# Patient Record
Sex: Female | Born: 1935 | Race: White | Hispanic: No | Smoking: Never smoker
Health system: Southern US, Community
[De-identification: ages and names within clinical notes are randomized; demographics above are authoritative.]

## PROBLEM LIST (undated history)

## (undated) DIAGNOSIS — Z9181 History of falling: Secondary | ICD-10-CM

## (undated) DIAGNOSIS — G903 Multi-system degeneration of the autonomic nervous system: Secondary | ICD-10-CM

## (undated) DIAGNOSIS — H9190 Unspecified hearing loss, unspecified ear: Secondary | ICD-10-CM

## (undated) DIAGNOSIS — R251 Tremor, unspecified: Secondary | ICD-10-CM

## (undated) DIAGNOSIS — M6259 Muscle wasting and atrophy, not elsewhere classified, multiple sites: Secondary | ICD-10-CM

## (undated) DIAGNOSIS — G9340 Encephalopathy, unspecified: Secondary | ICD-10-CM

## (undated) DIAGNOSIS — I160 Hypertensive urgency: Secondary | ICD-10-CM

## (undated) DIAGNOSIS — N049 Nephrotic syndrome with unspecified morphologic changes: Secondary | ICD-10-CM

## (undated) DIAGNOSIS — E871 Hypo-osmolality and hyponatremia: Secondary | ICD-10-CM

## (undated) DIAGNOSIS — F339 Major depressive disorder, recurrent, unspecified: Secondary | ICD-10-CM

## (undated) DIAGNOSIS — I10 Essential (primary) hypertension: Secondary | ICD-10-CM

## (undated) DIAGNOSIS — G250 Essential tremor: Secondary | ICD-10-CM

## (undated) DIAGNOSIS — R41841 Cognitive communication deficit: Secondary | ICD-10-CM

## (undated) HISTORY — DX: Essential (primary) hypertension: I10

## (undated) HISTORY — DX: Encephalopathy, unspecified: G93.40

## (undated) HISTORY — DX: Cognitive communication deficit: R41.841

## (undated) HISTORY — DX: Muscle wasting and atrophy, not elsewhere classified, multiple sites: M62.59

## (undated) HISTORY — DX: Nephrotic syndrome with unspecified morphologic changes: N04.9

## (undated) HISTORY — DX: History of falling: Z91.81

## (undated) HISTORY — PX: ABDOMINAL HYSTERECTOMY: SHX81

## (undated) HISTORY — PX: SPINAL FUSION: SHX223

## (undated) HISTORY — DX: Essential tremor: G25.0

## (undated) HISTORY — DX: Tremor, unspecified: R25.1

## (undated) HISTORY — DX: Hypo-osmolality and hyponatremia: E87.1

## (undated) HISTORY — DX: Hypertensive urgency: I16.0

## (undated) HISTORY — DX: Multi-system degeneration of the autonomic nervous system: G90.3

## (undated) HISTORY — DX: Unspecified hearing loss, unspecified ear: H91.90

## (undated) HISTORY — DX: Major depressive disorder, recurrent, unspecified: F33.9

---

## 2005-12-09 ENCOUNTER — Encounter: Admission: RE | Admit: 2005-12-09 | Discharge: 2005-12-09 | Payer: Self-pay | Admitting: Unknown Physician Specialty

## 2006-05-31 ENCOUNTER — Encounter: Admission: RE | Admit: 2006-05-31 | Discharge: 2006-05-31 | Payer: Self-pay | Admitting: Family Medicine

## 2006-11-28 ENCOUNTER — Encounter: Admission: RE | Admit: 2006-11-28 | Discharge: 2006-11-28 | Payer: Self-pay | Admitting: Family Medicine

## 2007-08-08 ENCOUNTER — Ambulatory Visit (HOSPITAL_COMMUNITY): Admission: RE | Admit: 2007-08-08 | Discharge: 2007-08-08 | Payer: Self-pay | Admitting: Nephrology

## 2007-08-10 ENCOUNTER — Ambulatory Visit (HOSPITAL_COMMUNITY): Admission: RE | Admit: 2007-08-10 | Discharge: 2007-08-11 | Payer: Self-pay | Admitting: Nephrology

## 2007-08-10 ENCOUNTER — Encounter (INDEPENDENT_AMBULATORY_CARE_PROVIDER_SITE_OTHER): Payer: Self-pay | Admitting: Nephrology

## 2009-01-09 IMAGING — US US BIOPSY-NO RADIOLOGIST
1 series · 8 of 8 positions shown · non-contrast
Comparison: none

CLINICAL DATA: Proteinuria

ULTRASOUND BIOPSY - NO RADIOLOGIST:

[Series 1: unknown · 0.27mm/px · 8 of 8 slices shown]
[im 1/8]
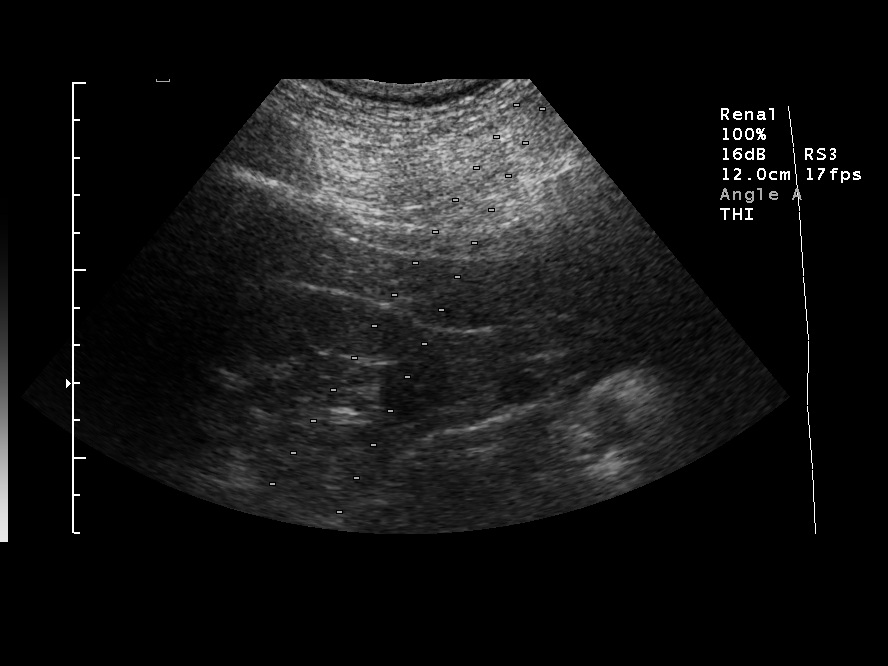
[im 2/8]
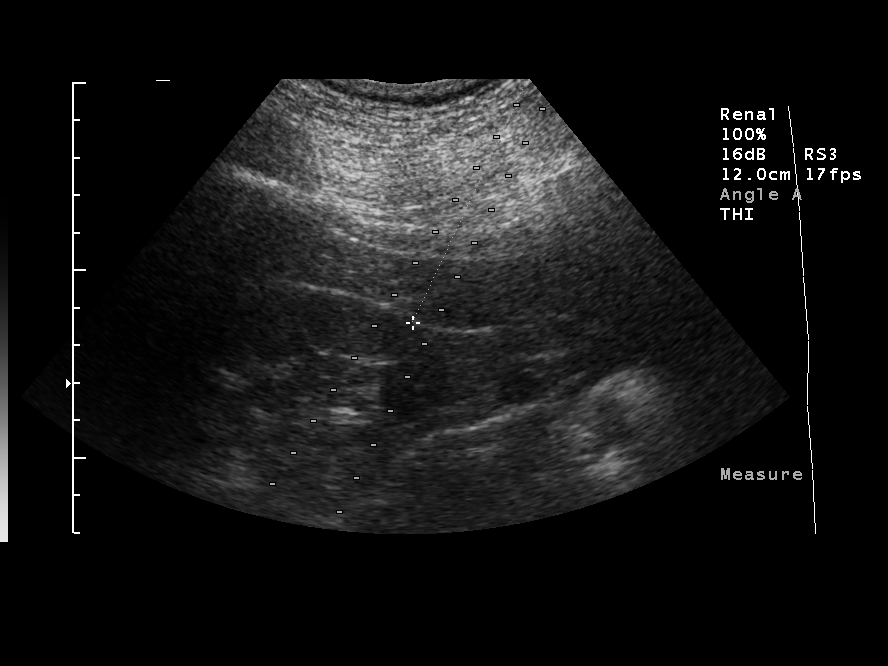
[im 3/8]
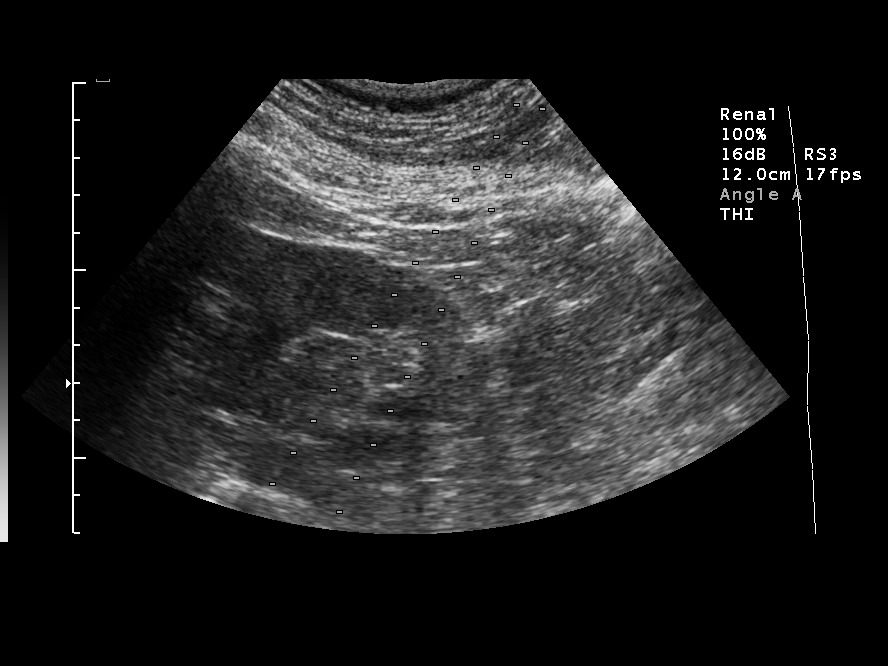
[im 4/8]
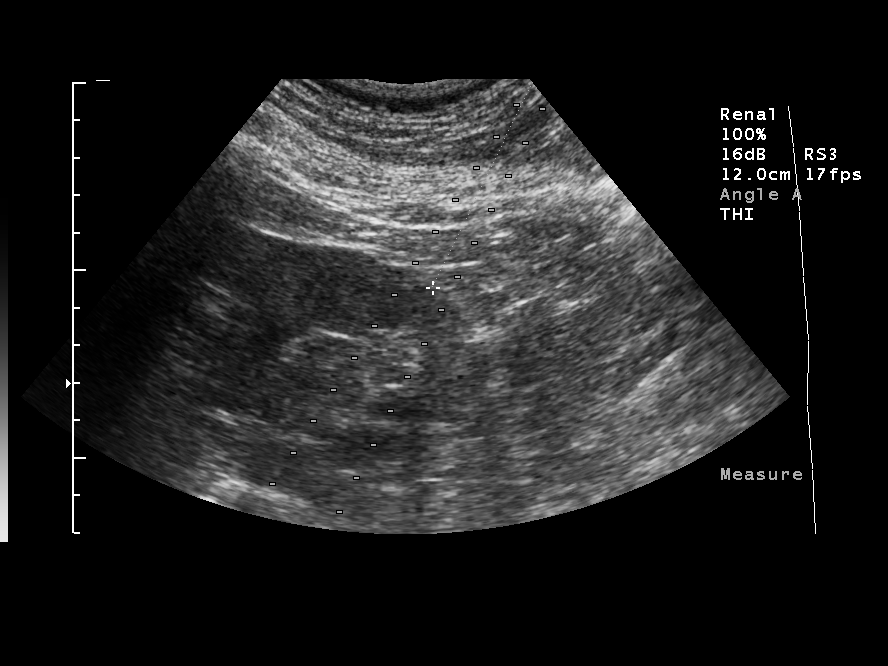
[im 5/8]
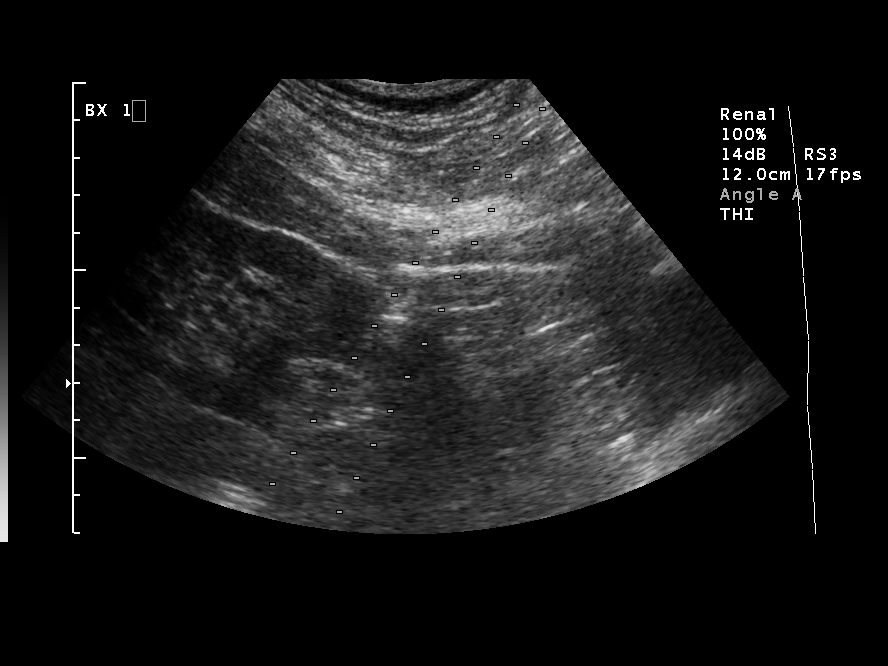
[im 6/8]
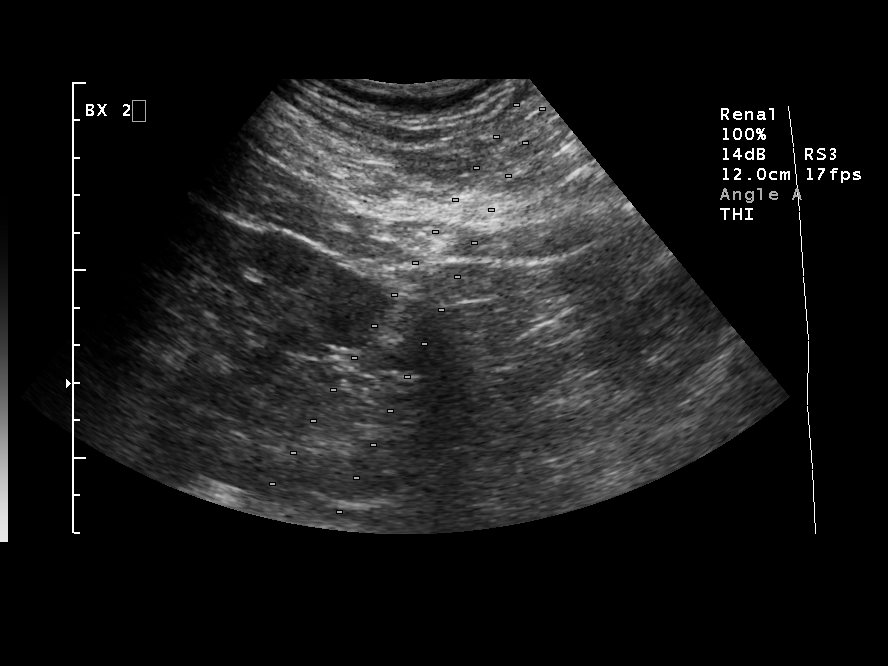
[im 7/8]
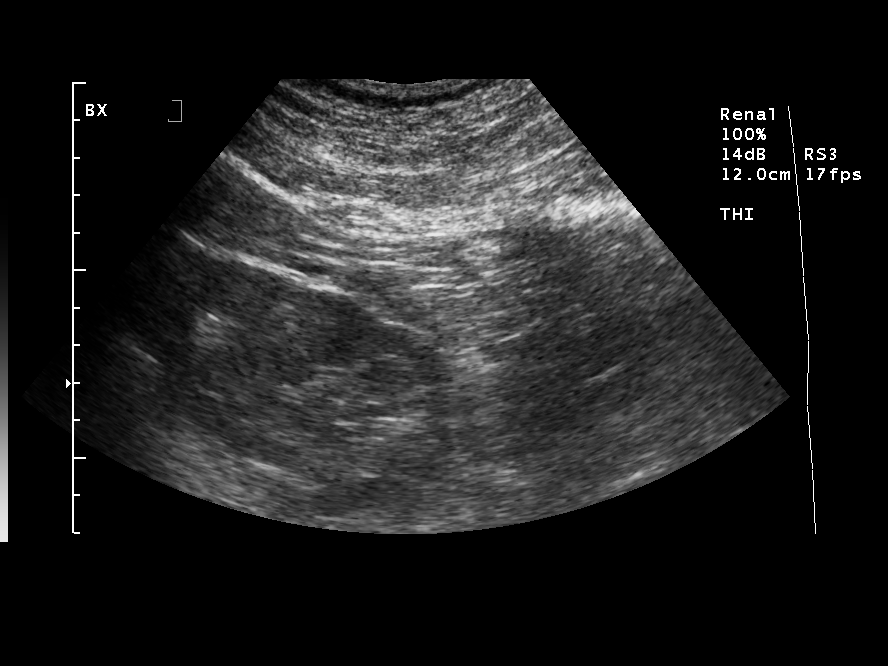
[im 8/8]
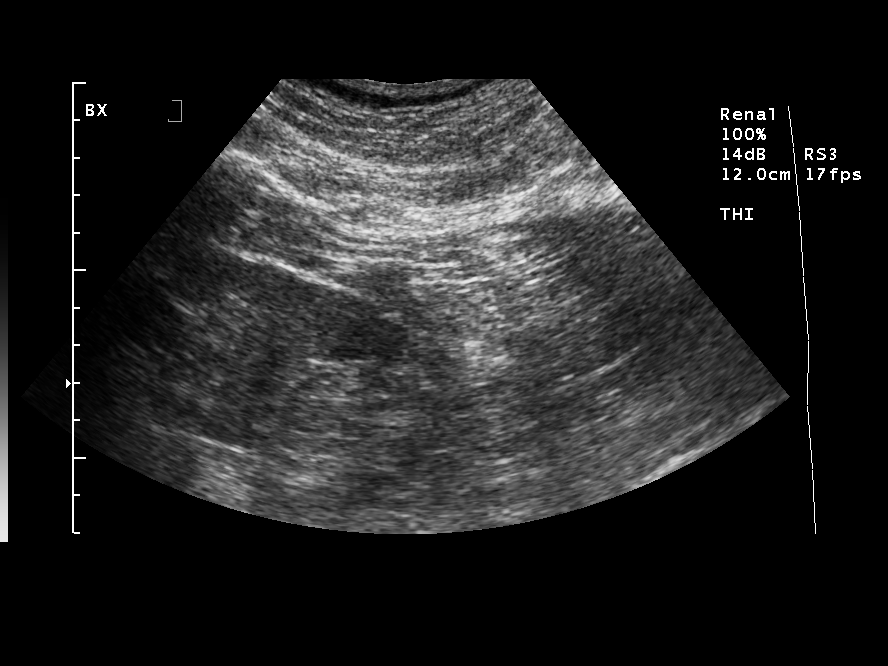

[8 of 8 positions shown; findings below may reference images not displayed]

FINDINGS: Ultrasound was provided to Dr. Jann for purposes
of random renal biopsy.  No radiologist was present for the
procedure.
IMPRESSION: Ultrasound provided to Dr. Jann for purposes of random
renal biopsy.

## 2011-01-19 LAB — CBC
HCT: 42.8
Hemoglobin: 15
MCHC: 34.8
MCV: 95.1
MCV: 94.6
Platelets: 276
Platelets: 254
RBC: 4.05
RBC: 4.26
RDW: 13.7
RDW: 13.5
RDW: 13.6
WBC: 11.9 — ABNORMAL HIGH

## 2011-01-19 LAB — COMPREHENSIVE METABOLIC PANEL
Albumin: 3 — ABNORMAL LOW
Alkaline Phosphatase: 63
BUN: 10
CO2: 29
Calcium: 9.5
Chloride: 97
Creatinine, Ser: 0.61
GFR calc non Af Amer: >60
Total Protein: 5.9 — ABNORMAL LOW

## 2011-01-19 LAB — TYPE AND SCREEN: ABO/RH(D): O POS

## 2011-01-19 LAB — PROTIME-INR
INR: 0.8
Prothrombin Time: 11.5 — ABNORMAL LOW

## 2011-01-19 LAB — DIFFERENTIAL
Eosinophils Absolute: 0.1
Lymphocytes Relative: 22
Lymphs Abs: 2.4
Monocytes Absolute: 0.6
Neutro Abs: 7.5

## 2011-05-03 DIAGNOSIS — R809 Proteinuria, unspecified: Secondary | ICD-10-CM | POA: Diagnosis not present

## 2011-05-03 DIAGNOSIS — N040 Nephrotic syndrome with minor glomerular abnormality: Secondary | ICD-10-CM | POA: Diagnosis not present

## 2011-05-11 DIAGNOSIS — R609 Edema, unspecified: Secondary | ICD-10-CM | POA: Diagnosis not present

## 2011-05-11 DIAGNOSIS — I10 Essential (primary) hypertension: Secondary | ICD-10-CM | POA: Diagnosis not present

## 2011-05-11 DIAGNOSIS — N040 Nephrotic syndrome with minor glomerular abnormality: Secondary | ICD-10-CM | POA: Diagnosis not present

## 2011-05-11 DIAGNOSIS — R809 Proteinuria, unspecified: Secondary | ICD-10-CM | POA: Diagnosis not present

## 2011-05-25 DIAGNOSIS — R809 Proteinuria, unspecified: Secondary | ICD-10-CM | POA: Diagnosis not present

## 2011-05-25 DIAGNOSIS — N040 Nephrotic syndrome with minor glomerular abnormality: Secondary | ICD-10-CM | POA: Diagnosis not present

## 2011-06-01 DIAGNOSIS — N040 Nephrotic syndrome with minor glomerular abnormality: Secondary | ICD-10-CM | POA: Diagnosis not present

## 2011-06-01 DIAGNOSIS — I10 Essential (primary) hypertension: Secondary | ICD-10-CM | POA: Diagnosis not present

## 2011-06-01 DIAGNOSIS — R809 Proteinuria, unspecified: Secondary | ICD-10-CM | POA: Diagnosis not present

## 2011-06-01 DIAGNOSIS — R609 Edema, unspecified: Secondary | ICD-10-CM | POA: Diagnosis not present

## 2011-06-29 DIAGNOSIS — N040 Nephrotic syndrome with minor glomerular abnormality: Secondary | ICD-10-CM | POA: Diagnosis not present

## 2011-06-29 DIAGNOSIS — R809 Proteinuria, unspecified: Secondary | ICD-10-CM | POA: Diagnosis not present

## 2011-07-30 DIAGNOSIS — N040 Nephrotic syndrome with minor glomerular abnormality: Secondary | ICD-10-CM | POA: Diagnosis not present

## 2011-07-30 DIAGNOSIS — D649 Anemia, unspecified: Secondary | ICD-10-CM | POA: Diagnosis not present

## 2011-07-30 DIAGNOSIS — R809 Proteinuria, unspecified: Secondary | ICD-10-CM | POA: Diagnosis not present

## 2011-08-06 DIAGNOSIS — R809 Proteinuria, unspecified: Secondary | ICD-10-CM | POA: Diagnosis not present

## 2011-08-06 DIAGNOSIS — N181 Chronic kidney disease, stage 1: Secondary | ICD-10-CM | POA: Diagnosis not present

## 2011-09-01 DIAGNOSIS — R809 Proteinuria, unspecified: Secondary | ICD-10-CM | POA: Diagnosis not present

## 2011-09-01 DIAGNOSIS — N040 Nephrotic syndrome with minor glomerular abnormality: Secondary | ICD-10-CM | POA: Diagnosis not present

## 2011-09-01 DIAGNOSIS — N390 Urinary tract infection, site not specified: Secondary | ICD-10-CM | POA: Diagnosis not present

## 2011-09-01 DIAGNOSIS — N181 Chronic kidney disease, stage 1: Secondary | ICD-10-CM | POA: Diagnosis not present

## 2011-09-22 DIAGNOSIS — R809 Proteinuria, unspecified: Secondary | ICD-10-CM | POA: Diagnosis not present

## 2011-09-22 DIAGNOSIS — N181 Chronic kidney disease, stage 1: Secondary | ICD-10-CM | POA: Diagnosis not present

## 2011-10-04 DIAGNOSIS — N181 Chronic kidney disease, stage 1: Secondary | ICD-10-CM | POA: Diagnosis not present

## 2011-10-04 DIAGNOSIS — N390 Urinary tract infection, site not specified: Secondary | ICD-10-CM | POA: Diagnosis not present

## 2011-10-04 DIAGNOSIS — R809 Proteinuria, unspecified: Secondary | ICD-10-CM | POA: Diagnosis not present

## 2011-10-07 DIAGNOSIS — I10 Essential (primary) hypertension: Secondary | ICD-10-CM | POA: Diagnosis not present

## 2011-10-07 DIAGNOSIS — R809 Proteinuria, unspecified: Secondary | ICD-10-CM | POA: Diagnosis not present

## 2011-10-07 DIAGNOSIS — N040 Nephrotic syndrome with minor glomerular abnormality: Secondary | ICD-10-CM | POA: Diagnosis not present

## 2011-11-15 DIAGNOSIS — N390 Urinary tract infection, site not specified: Secondary | ICD-10-CM | POA: Diagnosis not present

## 2011-11-15 DIAGNOSIS — R809 Proteinuria, unspecified: Secondary | ICD-10-CM | POA: Diagnosis not present

## 2011-11-15 DIAGNOSIS — I10 Essential (primary) hypertension: Secondary | ICD-10-CM | POA: Diagnosis not present

## 2011-11-22 DIAGNOSIS — N032 Chronic nephritic syndrome with diffuse membranous glomerulonephritis: Secondary | ICD-10-CM | POA: Diagnosis not present

## 2011-11-22 DIAGNOSIS — I10 Essential (primary) hypertension: Secondary | ICD-10-CM | POA: Diagnosis not present

## 2011-11-22 DIAGNOSIS — R809 Proteinuria, unspecified: Secondary | ICD-10-CM | POA: Diagnosis not present

## 2011-12-28 DIAGNOSIS — N040 Nephrotic syndrome with minor glomerular abnormality: Secondary | ICD-10-CM | POA: Diagnosis not present

## 2011-12-28 DIAGNOSIS — R809 Proteinuria, unspecified: Secondary | ICD-10-CM | POA: Diagnosis not present

## 2011-12-28 DIAGNOSIS — N189 Chronic kidney disease, unspecified: Secondary | ICD-10-CM | POA: Diagnosis not present

## 2012-01-04 DIAGNOSIS — R809 Proteinuria, unspecified: Secondary | ICD-10-CM | POA: Diagnosis not present

## 2012-01-04 DIAGNOSIS — N032 Chronic nephritic syndrome with diffuse membranous glomerulonephritis: Secondary | ICD-10-CM | POA: Diagnosis not present

## 2012-01-04 DIAGNOSIS — I10 Essential (primary) hypertension: Secondary | ICD-10-CM | POA: Diagnosis not present

## 2012-02-22 DIAGNOSIS — E049 Nontoxic goiter, unspecified: Secondary | ICD-10-CM | POA: Diagnosis not present

## 2012-02-22 DIAGNOSIS — Z23 Encounter for immunization: Secondary | ICD-10-CM | POA: Diagnosis not present

## 2012-02-22 DIAGNOSIS — N042 Nephrotic syndrome with diffuse membranous glomerulonephritis: Secondary | ICD-10-CM | POA: Diagnosis not present

## 2012-02-22 DIAGNOSIS — E7800 Pure hypercholesterolemia, unspecified: Secondary | ICD-10-CM | POA: Diagnosis not present

## 2012-02-22 DIAGNOSIS — I10 Essential (primary) hypertension: Secondary | ICD-10-CM | POA: Diagnosis not present

## 2012-03-07 DIAGNOSIS — Z1231 Encounter for screening mammogram for malignant neoplasm of breast: Secondary | ICD-10-CM | POA: Diagnosis not present

## 2012-05-02 DIAGNOSIS — R809 Proteinuria, unspecified: Secondary | ICD-10-CM | POA: Diagnosis not present

## 2012-05-02 DIAGNOSIS — N040 Nephrotic syndrome with minor glomerular abnormality: Secondary | ICD-10-CM | POA: Diagnosis not present

## 2012-05-02 DIAGNOSIS — I10 Essential (primary) hypertension: Secondary | ICD-10-CM | POA: Diagnosis not present

## 2012-05-02 DIAGNOSIS — D649 Anemia, unspecified: Secondary | ICD-10-CM | POA: Diagnosis not present

## 2012-05-09 DIAGNOSIS — R809 Proteinuria, unspecified: Secondary | ICD-10-CM | POA: Diagnosis not present

## 2012-05-09 DIAGNOSIS — I129 Hypertensive chronic kidney disease with stage 1 through stage 4 chronic kidney disease, or unspecified chronic kidney disease: Secondary | ICD-10-CM | POA: Diagnosis not present

## 2012-05-09 DIAGNOSIS — N040 Nephrotic syndrome with minor glomerular abnormality: Secondary | ICD-10-CM | POA: Diagnosis not present

## 2012-05-09 DIAGNOSIS — I10 Essential (primary) hypertension: Secondary | ICD-10-CM | POA: Diagnosis not present

## 2012-11-06 DIAGNOSIS — N181 Chronic kidney disease, stage 1: Secondary | ICD-10-CM | POA: Diagnosis not present

## 2012-11-06 DIAGNOSIS — R809 Proteinuria, unspecified: Secondary | ICD-10-CM | POA: Diagnosis not present

## 2012-11-06 DIAGNOSIS — N040 Nephrotic syndrome with minor glomerular abnormality: Secondary | ICD-10-CM | POA: Diagnosis not present

## 2012-11-15 DIAGNOSIS — N390 Urinary tract infection, site not specified: Secondary | ICD-10-CM | POA: Diagnosis not present

## 2012-11-15 DIAGNOSIS — N040 Nephrotic syndrome with minor glomerular abnormality: Secondary | ICD-10-CM | POA: Diagnosis not present

## 2012-11-15 DIAGNOSIS — R809 Proteinuria, unspecified: Secondary | ICD-10-CM | POA: Diagnosis not present

## 2012-11-15 DIAGNOSIS — I10 Essential (primary) hypertension: Secondary | ICD-10-CM | POA: Diagnosis not present

## 2012-12-18 DIAGNOSIS — R809 Proteinuria, unspecified: Secondary | ICD-10-CM | POA: Diagnosis not present

## 2012-12-18 DIAGNOSIS — I10 Essential (primary) hypertension: Secondary | ICD-10-CM | POA: Diagnosis not present

## 2012-12-18 DIAGNOSIS — N040 Nephrotic syndrome with minor glomerular abnormality: Secondary | ICD-10-CM | POA: Diagnosis not present

## 2013-01-08 DIAGNOSIS — I10 Essential (primary) hypertension: Secondary | ICD-10-CM | POA: Diagnosis not present

## 2013-01-08 DIAGNOSIS — N040 Nephrotic syndrome with minor glomerular abnormality: Secondary | ICD-10-CM | POA: Diagnosis not present

## 2013-01-08 DIAGNOSIS — N390 Urinary tract infection, site not specified: Secondary | ICD-10-CM | POA: Diagnosis not present

## 2013-01-08 DIAGNOSIS — R809 Proteinuria, unspecified: Secondary | ICD-10-CM | POA: Diagnosis not present

## 2013-01-23 DIAGNOSIS — I10 Essential (primary) hypertension: Secondary | ICD-10-CM | POA: Diagnosis not present

## 2013-01-23 DIAGNOSIS — R809 Proteinuria, unspecified: Secondary | ICD-10-CM | POA: Diagnosis not present

## 2013-01-23 DIAGNOSIS — N040 Nephrotic syndrome with minor glomerular abnormality: Secondary | ICD-10-CM | POA: Diagnosis not present

## 2013-02-19 DIAGNOSIS — E049 Nontoxic goiter, unspecified: Secondary | ICD-10-CM | POA: Diagnosis not present

## 2013-02-19 DIAGNOSIS — E7800 Pure hypercholesterolemia, unspecified: Secondary | ICD-10-CM | POA: Diagnosis not present

## 2013-02-19 DIAGNOSIS — I10 Essential (primary) hypertension: Secondary | ICD-10-CM | POA: Diagnosis not present

## 2013-02-19 DIAGNOSIS — Z23 Encounter for immunization: Secondary | ICD-10-CM | POA: Diagnosis not present

## 2013-03-08 DIAGNOSIS — H903 Sensorineural hearing loss, bilateral: Secondary | ICD-10-CM | POA: Diagnosis not present

## 2013-03-12 DIAGNOSIS — I10 Essential (primary) hypertension: Secondary | ICD-10-CM | POA: Diagnosis not present

## 2013-03-12 DIAGNOSIS — R809 Proteinuria, unspecified: Secondary | ICD-10-CM | POA: Diagnosis not present

## 2013-03-12 DIAGNOSIS — N040 Nephrotic syndrome with minor glomerular abnormality: Secondary | ICD-10-CM | POA: Diagnosis not present

## 2013-03-20 DIAGNOSIS — K5900 Constipation, unspecified: Secondary | ICD-10-CM | POA: Diagnosis not present

## 2013-03-20 DIAGNOSIS — N049 Nephrotic syndrome with unspecified morphologic changes: Secondary | ICD-10-CM | POA: Diagnosis not present

## 2013-03-20 DIAGNOSIS — I10 Essential (primary) hypertension: Secondary | ICD-10-CM | POA: Diagnosis not present

## 2013-05-07 DIAGNOSIS — N049 Nephrotic syndrome with unspecified morphologic changes: Secondary | ICD-10-CM | POA: Diagnosis not present

## 2013-05-08 DIAGNOSIS — N390 Urinary tract infection, site not specified: Secondary | ICD-10-CM | POA: Diagnosis not present

## 2013-05-15 DIAGNOSIS — R809 Proteinuria, unspecified: Secondary | ICD-10-CM | POA: Diagnosis not present

## 2013-05-15 DIAGNOSIS — K5900 Constipation, unspecified: Secondary | ICD-10-CM | POA: Diagnosis not present

## 2013-05-15 DIAGNOSIS — I10 Essential (primary) hypertension: Secondary | ICD-10-CM | POA: Diagnosis not present

## 2013-05-15 DIAGNOSIS — N049 Nephrotic syndrome with unspecified morphologic changes: Secondary | ICD-10-CM | POA: Diagnosis not present

## 2013-05-15 DIAGNOSIS — N040 Nephrotic syndrome with minor glomerular abnormality: Secondary | ICD-10-CM | POA: Diagnosis not present

## 2013-05-23 DIAGNOSIS — H353190 Nonexudative age-related macular degeneration, unspecified eye, stage unspecified: Secondary | ICD-10-CM | POA: Diagnosis not present

## 2013-05-23 DIAGNOSIS — H5200 Hypermetropia, unspecified eye: Secondary | ICD-10-CM | POA: Diagnosis not present

## 2013-09-10 DIAGNOSIS — N049 Nephrotic syndrome with unspecified morphologic changes: Secondary | ICD-10-CM | POA: Diagnosis not present

## 2013-09-18 DIAGNOSIS — N049 Nephrotic syndrome with unspecified morphologic changes: Secondary | ICD-10-CM | POA: Diagnosis not present

## 2013-09-18 DIAGNOSIS — I10 Essential (primary) hypertension: Secondary | ICD-10-CM | POA: Diagnosis not present

## 2013-09-18 DIAGNOSIS — K5900 Constipation, unspecified: Secondary | ICD-10-CM | POA: Diagnosis not present

## 2014-01-01 DIAGNOSIS — N049 Nephrotic syndrome with unspecified morphologic changes: Secondary | ICD-10-CM | POA: Diagnosis not present

## 2014-01-08 DIAGNOSIS — I10 Essential (primary) hypertension: Secondary | ICD-10-CM | POA: Diagnosis not present

## 2014-01-08 DIAGNOSIS — K5900 Constipation, unspecified: Secondary | ICD-10-CM | POA: Diagnosis not present

## 2014-01-08 DIAGNOSIS — N049 Nephrotic syndrome with unspecified morphologic changes: Secondary | ICD-10-CM | POA: Diagnosis not present

## 2014-02-11 DIAGNOSIS — H902 Conductive hearing loss, unspecified: Secondary | ICD-10-CM | POA: Diagnosis not present

## 2014-02-11 DIAGNOSIS — Z23 Encounter for immunization: Secondary | ICD-10-CM | POA: Diagnosis not present

## 2014-02-11 DIAGNOSIS — E780 Pure hypercholesterolemia: Secondary | ICD-10-CM | POA: Diagnosis not present

## 2014-02-11 DIAGNOSIS — N039 Chronic nephritic syndrome with unspecified morphologic changes: Secondary | ICD-10-CM | POA: Diagnosis not present

## 2014-02-11 DIAGNOSIS — E049 Nontoxic goiter, unspecified: Secondary | ICD-10-CM | POA: Diagnosis not present

## 2014-02-11 DIAGNOSIS — Z0000 Encounter for general adult medical examination without abnormal findings: Secondary | ICD-10-CM | POA: Diagnosis not present

## 2014-02-11 DIAGNOSIS — Z79899 Other long term (current) drug therapy: Secondary | ICD-10-CM | POA: Diagnosis not present

## 2014-02-11 DIAGNOSIS — I10 Essential (primary) hypertension: Secondary | ICD-10-CM | POA: Diagnosis not present

## 2014-02-19 DIAGNOSIS — R42 Dizziness and giddiness: Secondary | ICD-10-CM | POA: Diagnosis not present

## 2014-06-24 DIAGNOSIS — N049 Nephrotic syndrome with unspecified morphologic changes: Secondary | ICD-10-CM | POA: Diagnosis not present

## 2014-07-02 DIAGNOSIS — N049 Nephrotic syndrome with unspecified morphologic changes: Secondary | ICD-10-CM | POA: Diagnosis not present

## 2014-07-02 DIAGNOSIS — I10 Essential (primary) hypertension: Secondary | ICD-10-CM | POA: Diagnosis not present

## 2014-07-02 DIAGNOSIS — K5900 Constipation, unspecified: Secondary | ICD-10-CM | POA: Diagnosis not present

## 2014-08-13 DIAGNOSIS — R233 Spontaneous ecchymoses: Secondary | ICD-10-CM | POA: Diagnosis not present

## 2014-08-13 DIAGNOSIS — L570 Actinic keratosis: Secondary | ICD-10-CM | POA: Diagnosis not present

## 2014-11-08 DIAGNOSIS — H3561 Retinal hemorrhage, right eye: Secondary | ICD-10-CM | POA: Diagnosis not present

## 2014-11-08 DIAGNOSIS — H2589 Other age-related cataract: Secondary | ICD-10-CM | POA: Diagnosis not present

## 2015-01-07 DIAGNOSIS — N040 Nephrotic syndrome with minor glomerular abnormality: Secondary | ICD-10-CM | POA: Diagnosis not present

## 2015-01-17 DIAGNOSIS — Z23 Encounter for immunization: Secondary | ICD-10-CM | POA: Diagnosis not present

## 2015-01-17 DIAGNOSIS — I10 Essential (primary) hypertension: Secondary | ICD-10-CM | POA: Diagnosis not present

## 2015-01-17 DIAGNOSIS — N049 Nephrotic syndrome with unspecified morphologic changes: Secondary | ICD-10-CM | POA: Diagnosis not present

## 2015-05-21 DIAGNOSIS — E049 Nontoxic goiter, unspecified: Secondary | ICD-10-CM | POA: Diagnosis not present

## 2015-05-21 DIAGNOSIS — M059 Rheumatoid arthritis with rheumatoid factor, unspecified: Secondary | ICD-10-CM | POA: Diagnosis not present

## 2015-05-21 DIAGNOSIS — E785 Hyperlipidemia, unspecified: Secondary | ICD-10-CM | POA: Diagnosis not present

## 2015-05-21 DIAGNOSIS — I10 Essential (primary) hypertension: Secondary | ICD-10-CM | POA: Diagnosis not present

## 2015-05-21 DIAGNOSIS — K210 Gastro-esophageal reflux disease with esophagitis: Secondary | ICD-10-CM | POA: Diagnosis not present

## 2015-05-21 DIAGNOSIS — Z79899 Other long term (current) drug therapy: Secondary | ICD-10-CM | POA: Diagnosis not present

## 2015-05-21 DIAGNOSIS — Z0000 Encounter for general adult medical examination without abnormal findings: Secondary | ICD-10-CM | POA: Diagnosis not present

## 2015-05-21 DIAGNOSIS — N040 Nephrotic syndrome with minor glomerular abnormality: Secondary | ICD-10-CM | POA: Diagnosis not present

## 2015-07-21 DIAGNOSIS — I10 Essential (primary) hypertension: Secondary | ICD-10-CM | POA: Diagnosis not present

## 2015-07-21 DIAGNOSIS — M059 Rheumatoid arthritis with rheumatoid factor, unspecified: Secondary | ICD-10-CM | POA: Diagnosis not present

## 2015-07-21 DIAGNOSIS — E8881 Metabolic syndrome: Secondary | ICD-10-CM | POA: Diagnosis not present

## 2015-07-21 DIAGNOSIS — K210 Gastro-esophageal reflux disease with esophagitis: Secondary | ICD-10-CM | POA: Diagnosis not present

## 2015-07-22 DIAGNOSIS — N049 Nephrotic syndrome with unspecified morphologic changes: Secondary | ICD-10-CM | POA: Diagnosis not present

## 2015-07-22 DIAGNOSIS — N390 Urinary tract infection, site not specified: Secondary | ICD-10-CM | POA: Diagnosis not present

## 2015-07-25 DIAGNOSIS — N049 Nephrotic syndrome with unspecified morphologic changes: Secondary | ICD-10-CM | POA: Diagnosis not present

## 2015-07-25 DIAGNOSIS — I10 Essential (primary) hypertension: Secondary | ICD-10-CM | POA: Diagnosis not present

## 2015-09-24 DIAGNOSIS — I10 Essential (primary) hypertension: Secondary | ICD-10-CM | POA: Diagnosis not present

## 2015-09-24 DIAGNOSIS — K210 Gastro-esophageal reflux disease with esophagitis: Secondary | ICD-10-CM | POA: Diagnosis not present

## 2015-09-24 DIAGNOSIS — M961 Postlaminectomy syndrome, not elsewhere classified: Secondary | ICD-10-CM | POA: Diagnosis not present

## 2015-09-24 DIAGNOSIS — E8881 Metabolic syndrome: Secondary | ICD-10-CM | POA: Diagnosis not present

## 2016-01-02 DIAGNOSIS — N390 Urinary tract infection, site not specified: Secondary | ICD-10-CM | POA: Diagnosis not present

## 2016-01-02 DIAGNOSIS — N049 Nephrotic syndrome with unspecified morphologic changes: Secondary | ICD-10-CM | POA: Diagnosis not present

## 2016-01-09 DIAGNOSIS — K5900 Constipation, unspecified: Secondary | ICD-10-CM | POA: Diagnosis not present

## 2016-01-09 DIAGNOSIS — I10 Essential (primary) hypertension: Secondary | ICD-10-CM | POA: Diagnosis not present

## 2016-01-09 DIAGNOSIS — Z6829 Body mass index (BMI) 29.0-29.9, adult: Secondary | ICD-10-CM | POA: Diagnosis not present

## 2016-01-09 DIAGNOSIS — N049 Nephrotic syndrome with unspecified morphologic changes: Secondary | ICD-10-CM | POA: Diagnosis not present

## 2016-02-02 DIAGNOSIS — Z23 Encounter for immunization: Secondary | ICD-10-CM | POA: Diagnosis not present

## 2016-02-17 DIAGNOSIS — J208 Acute bronchitis due to other specified organisms: Secondary | ICD-10-CM | POA: Diagnosis not present

## 2016-02-17 DIAGNOSIS — B9689 Other specified bacterial agents as the cause of diseases classified elsewhere: Secondary | ICD-10-CM | POA: Diagnosis not present

## 2016-02-17 DIAGNOSIS — R0982 Postnasal drip: Secondary | ICD-10-CM | POA: Diagnosis not present

## 2016-02-17 DIAGNOSIS — J309 Allergic rhinitis, unspecified: Secondary | ICD-10-CM | POA: Diagnosis not present

## 2016-02-17 DIAGNOSIS — I10 Essential (primary) hypertension: Secondary | ICD-10-CM | POA: Diagnosis not present

## 2016-08-16 DIAGNOSIS — N052 Unspecified nephritic syndrome with diffuse membranous glomerulonephritis: Secondary | ICD-10-CM | POA: Diagnosis not present

## 2016-08-16 DIAGNOSIS — N390 Urinary tract infection, site not specified: Secondary | ICD-10-CM | POA: Diagnosis not present

## 2016-08-20 DIAGNOSIS — N390 Urinary tract infection, site not specified: Secondary | ICD-10-CM | POA: Diagnosis not present

## 2016-08-20 DIAGNOSIS — Z6828 Body mass index (BMI) 28.0-28.9, adult: Secondary | ICD-10-CM | POA: Diagnosis not present

## 2016-08-20 DIAGNOSIS — N049 Nephrotic syndrome with unspecified morphologic changes: Secondary | ICD-10-CM | POA: Diagnosis not present

## 2016-08-20 DIAGNOSIS — K5900 Constipation, unspecified: Secondary | ICD-10-CM | POA: Diagnosis not present

## 2016-08-20 DIAGNOSIS — I10 Essential (primary) hypertension: Secondary | ICD-10-CM | POA: Diagnosis not present

## 2016-11-24 DIAGNOSIS — J309 Allergic rhinitis, unspecified: Secondary | ICD-10-CM | POA: Diagnosis not present

## 2016-11-24 DIAGNOSIS — E7800 Pure hypercholesterolemia, unspecified: Secondary | ICD-10-CM | POA: Diagnosis not present

## 2016-11-24 DIAGNOSIS — M5412 Radiculopathy, cervical region: Secondary | ICD-10-CM | POA: Diagnosis not present

## 2016-11-24 DIAGNOSIS — R0982 Postnasal drip: Secondary | ICD-10-CM | POA: Diagnosis not present

## 2016-11-24 DIAGNOSIS — N039 Chronic nephritic syndrome with unspecified morphologic changes: Secondary | ICD-10-CM | POA: Diagnosis not present

## 2016-11-24 DIAGNOSIS — Z0000 Encounter for general adult medical examination without abnormal findings: Secondary | ICD-10-CM | POA: Diagnosis not present

## 2016-11-24 DIAGNOSIS — I10 Essential (primary) hypertension: Secondary | ICD-10-CM | POA: Diagnosis not present

## 2016-11-24 DIAGNOSIS — Z79899 Other long term (current) drug therapy: Secondary | ICD-10-CM | POA: Diagnosis not present

## 2016-11-24 DIAGNOSIS — E785 Hyperlipidemia, unspecified: Secondary | ICD-10-CM | POA: Diagnosis not present

## 2017-02-07 DIAGNOSIS — Z23 Encounter for immunization: Secondary | ICD-10-CM | POA: Diagnosis not present

## 2017-02-18 DIAGNOSIS — N390 Urinary tract infection, site not specified: Secondary | ICD-10-CM | POA: Diagnosis not present

## 2017-02-18 DIAGNOSIS — N049 Nephrotic syndrome with unspecified morphologic changes: Secondary | ICD-10-CM | POA: Diagnosis not present

## 2017-02-25 DIAGNOSIS — I10 Essential (primary) hypertension: Secondary | ICD-10-CM | POA: Diagnosis not present

## 2017-02-25 DIAGNOSIS — Z6828 Body mass index (BMI) 28.0-28.9, adult: Secondary | ICD-10-CM | POA: Diagnosis not present

## 2017-02-25 DIAGNOSIS — K5900 Constipation, unspecified: Secondary | ICD-10-CM | POA: Diagnosis not present

## 2017-02-25 DIAGNOSIS — N049 Nephrotic syndrome with unspecified morphologic changes: Secondary | ICD-10-CM | POA: Diagnosis not present

## 2017-02-25 DIAGNOSIS — N390 Urinary tract infection, site not specified: Secondary | ICD-10-CM | POA: Diagnosis not present

## 2017-05-24 DIAGNOSIS — Z6826 Body mass index (BMI) 26.0-26.9, adult: Secondary | ICD-10-CM | POA: Diagnosis not present

## 2017-05-24 DIAGNOSIS — I10 Essential (primary) hypertension: Secondary | ICD-10-CM | POA: Diagnosis not present

## 2017-10-11 DIAGNOSIS — B029 Zoster without complications: Secondary | ICD-10-CM | POA: Diagnosis not present

## 2017-10-11 DIAGNOSIS — R21 Rash and other nonspecific skin eruption: Secondary | ICD-10-CM | POA: Diagnosis not present

## 2017-10-25 DIAGNOSIS — E559 Vitamin D deficiency, unspecified: Secondary | ICD-10-CM | POA: Diagnosis not present

## 2017-10-25 DIAGNOSIS — B0229 Other postherpetic nervous system involvement: Secondary | ICD-10-CM | POA: Diagnosis not present

## 2017-10-25 DIAGNOSIS — R5383 Other fatigue: Secondary | ICD-10-CM | POA: Diagnosis not present

## 2017-10-25 DIAGNOSIS — Z6826 Body mass index (BMI) 26.0-26.9, adult: Secondary | ICD-10-CM | POA: Diagnosis not present

## 2017-10-25 DIAGNOSIS — I10 Essential (primary) hypertension: Secondary | ICD-10-CM | POA: Diagnosis not present

## 2017-10-25 DIAGNOSIS — D510 Vitamin B12 deficiency anemia due to intrinsic factor deficiency: Secondary | ICD-10-CM | POA: Diagnosis not present

## 2017-10-28 DIAGNOSIS — D510 Vitamin B12 deficiency anemia due to intrinsic factor deficiency: Secondary | ICD-10-CM | POA: Diagnosis not present

## 2017-11-04 DIAGNOSIS — D510 Vitamin B12 deficiency anemia due to intrinsic factor deficiency: Secondary | ICD-10-CM | POA: Diagnosis not present

## 2017-11-11 DIAGNOSIS — D510 Vitamin B12 deficiency anemia due to intrinsic factor deficiency: Secondary | ICD-10-CM | POA: Diagnosis not present

## 2017-11-18 DIAGNOSIS — D510 Vitamin B12 deficiency anemia due to intrinsic factor deficiency: Secondary | ICD-10-CM | POA: Diagnosis not present

## 2017-11-25 DIAGNOSIS — D510 Vitamin B12 deficiency anemia due to intrinsic factor deficiency: Secondary | ICD-10-CM | POA: Diagnosis not present

## 2017-12-02 DIAGNOSIS — D510 Vitamin B12 deficiency anemia due to intrinsic factor deficiency: Secondary | ICD-10-CM | POA: Diagnosis not present

## 2017-12-09 DIAGNOSIS — D510 Vitamin B12 deficiency anemia due to intrinsic factor deficiency: Secondary | ICD-10-CM | POA: Diagnosis not present

## 2017-12-16 DIAGNOSIS — D510 Vitamin B12 deficiency anemia due to intrinsic factor deficiency: Secondary | ICD-10-CM | POA: Diagnosis not present

## 2017-12-23 DIAGNOSIS — D510 Vitamin B12 deficiency anemia due to intrinsic factor deficiency: Secondary | ICD-10-CM | POA: Diagnosis not present

## 2017-12-23 DIAGNOSIS — D72829 Elevated white blood cell count, unspecified: Secondary | ICD-10-CM | POA: Diagnosis not present

## 2017-12-30 DIAGNOSIS — D510 Vitamin B12 deficiency anemia due to intrinsic factor deficiency: Secondary | ICD-10-CM | POA: Diagnosis not present

## 2018-01-06 DIAGNOSIS — D510 Vitamin B12 deficiency anemia due to intrinsic factor deficiency: Secondary | ICD-10-CM | POA: Diagnosis not present

## 2018-01-13 DIAGNOSIS — D510 Vitamin B12 deficiency anemia due to intrinsic factor deficiency: Secondary | ICD-10-CM | POA: Diagnosis not present

## 2018-01-20 DIAGNOSIS — D510 Vitamin B12 deficiency anemia due to intrinsic factor deficiency: Secondary | ICD-10-CM | POA: Diagnosis not present

## 2018-01-27 DIAGNOSIS — D510 Vitamin B12 deficiency anemia due to intrinsic factor deficiency: Secondary | ICD-10-CM | POA: Diagnosis not present

## 2018-02-07 DIAGNOSIS — Z23 Encounter for immunization: Secondary | ICD-10-CM | POA: Diagnosis not present

## 2018-02-07 DIAGNOSIS — Z9181 History of falling: Secondary | ICD-10-CM | POA: Diagnosis not present

## 2018-02-07 DIAGNOSIS — Z79899 Other long term (current) drug therapy: Secondary | ICD-10-CM | POA: Diagnosis not present

## 2018-02-07 DIAGNOSIS — I10 Essential (primary) hypertension: Secondary | ICD-10-CM | POA: Diagnosis not present

## 2018-02-07 DIAGNOSIS — Z1331 Encounter for screening for depression: Secondary | ICD-10-CM | POA: Diagnosis not present

## 2018-02-07 DIAGNOSIS — R251 Tremor, unspecified: Secondary | ICD-10-CM | POA: Diagnosis not present

## 2018-02-18 DIAGNOSIS — J209 Acute bronchitis, unspecified: Secondary | ICD-10-CM | POA: Diagnosis not present

## 2018-03-13 DIAGNOSIS — N052 Unspecified nephritic syndrome with diffuse membranous glomerulonephritis: Secondary | ICD-10-CM | POA: Diagnosis not present

## 2018-03-17 DIAGNOSIS — N390 Urinary tract infection, site not specified: Secondary | ICD-10-CM | POA: Diagnosis not present

## 2018-03-17 DIAGNOSIS — I10 Essential (primary) hypertension: Secondary | ICD-10-CM | POA: Diagnosis not present

## 2018-03-17 DIAGNOSIS — K5900 Constipation, unspecified: Secondary | ICD-10-CM | POA: Diagnosis not present

## 2018-03-17 DIAGNOSIS — Z6828 Body mass index (BMI) 28.0-28.9, adult: Secondary | ICD-10-CM | POA: Diagnosis not present

## 2018-03-17 DIAGNOSIS — N049 Nephrotic syndrome with unspecified morphologic changes: Secondary | ICD-10-CM | POA: Diagnosis not present

## 2018-04-06 ENCOUNTER — Telehealth: Payer: Self-pay

## 2018-04-06 NOTE — Telephone Encounter (Signed)
I attempted to reach the patient at numbers [phone]) [phone] (H). Patient was not available at either numbers and currently does not have a working Clinical cytogeneticist. I left a vm on both numbers listed and advised due to the possibility of inclement weather our office would be opening at 9:00 am on 04/07/2018. I advised due to this change the patient's 8:30 appointment would need to be rescheduled. I advised the patient to call back at her earliest convenience to reschedule. MB RN.

## 2018-04-07 ENCOUNTER — Ambulatory Visit: Payer: Medicare Other | Admitting: Neurology

## 2018-04-07 ENCOUNTER — Encounter

## 2018-06-13 DIAGNOSIS — H25812 Combined forms of age-related cataract, left eye: Secondary | ICD-10-CM | POA: Diagnosis not present

## 2018-06-22 ENCOUNTER — Ambulatory Visit (INDEPENDENT_AMBULATORY_CARE_PROVIDER_SITE_OTHER): Payer: Medicare Other | Admitting: Neurology

## 2018-06-22 ENCOUNTER — Encounter: Payer: Self-pay | Admitting: Neurology

## 2018-06-22 DIAGNOSIS — G250 Essential tremor: Secondary | ICD-10-CM | POA: Diagnosis not present

## 2018-06-22 HISTORY — DX: Essential tremor: G25.0

## 2018-06-22 MED ORDER — PROPRANOLOL HCL 20 MG PO TABS: 20.0000 mg | ORAL_TABLET | Freq: Two times a day (BID) | ORAL | 3 refills | Status: DC

## 2018-06-22 NOTE — Patient Instructions (Signed)
We will start propranolol for the tremor.  Inderal (propranolol) is a blood pressure medication that is commonly used for migraine headaches. This is a type of beta blocker. The most common side effects include low heart rate, dizziness, fatigue, and increased depression. This medication may worsen asthma. If you believe that you are having side effects on this medication, please contact our office.  

## 2018-06-22 NOTE — Progress Notes (Signed)
Reason for visit: Essential tremor  Referring physician: Dr. Bluford Main is a 85 y.o. female  History of present illness:  Ms. Tate is an 85 year old right-handed white female with a history of an essential tremor.  The patient has had tremor to some degree over the last 5 years, she has multiple family members who has had tremor, her paternal grandmother, one brother and 1 sister, and her daughter have some tremor.  Most individuals have been in her 51s before the tremor has started.  The patient has within the last year developed a side to side head tremor and a jaw tremor, the tremor initially started 5 years ago with the right arm but spread to the left arm as well.  She now has some vocal tremor as well.  The patient has had to give up cooking because of the tremor, she finds it difficult to write or to feed herself.  The patient does have some gait instability, she has not had any falls, she will use a cane on occasion to ambulate.  She reports some numbness in the hands, she feels "weak all over".  She is sent to this office for an evaluation.  Past Medical History:  Diagnosis Date  . Hearing loss   . High blood pressure   . Tremor     Past Surgical History:  Procedure Laterality Date  . ABDOMINAL HYSTERECTOMY    . SPINAL FUSION      Family History  Problem Relation Age of Onset  . Alzheimer's disease Mother   . Tremor Sister   . Tremor Brother   . Tremor Paternal Grandmother   . Alzheimer's disease Sister     Social history:  reports that she has never smoked. She has never used smokeless tobacco. She reports that she does not drink alcohol or use drugs.  Medications:  Prior to Admission medications   Medication Sig Start Date End Date Taking? Authorizing Provider  acetaminophen (TYLENOL) 500 MG tablet Take 500 mg by mouth every 6 (six) hours as needed.   Yes [provider]  lisinopril-hydrochlorothiazide (PRINZIDE,ZESTORETIC) 20-12.5 MG  tablet TAKE 1/2 TABLET QD 04/21/18  Yes [provider]  loratadine (ALLERGY) 10 MG tablet Take 10 mg by mouth daily.   Yes [provider]  methylPREDNISolone (MEDROL) 4 MG tablet Take 4 mg by mouth daily.  02/18/18  Yes [provider]  Multiple Vitamins-Minerals (MULTIVITAMIN ADULT PO) Take by mouth.   Yes [provider]      Allergies  Allergen Reactions  . Codeine Nausea And Vomiting    ROS:  Out of a complete 14 system review of symptoms, the patient complains only of the following symptoms, and all other reviewed systems are negative.  Hearing loss Joint pain Numbness Urination problems Tremor  Blood pressure (!) 188/81, pulse 78, height 5\' 1"  (1.549 m), weight 160 lb 5 oz (72.7 kg).  Physical Exam  General: The patient is alert and cooperative at the time of the examination.  Eyes: Pupils are equal, round, and reactive to light. Discs are flat bilaterally.  Neck: The neck is supple, no carotid bruits are noted.  Respiratory: The respiratory examination is clear.  Cardiovascular: The cardiovascular examination reveals a regular rate and rhythm, no obvious murmurs or rubs are noted.  Skin: Extremities are without significant edema.  Neurologic Exam  Mental status: The patient is alert and oriented x 3 at the time of the examination. The patient has apparent  normal recent and remote memory, with an apparently normal attention span and concentration ability.  Cranial nerves: Facial symmetry is present. There is good sensation of the face to pinprick and soft touch bilaterally. The strength of the facial muscles and the muscles to head turning and shoulder shrug are normal bilaterally. Speech is well enunciated, no aphasia or dysarthria is noted. Extraocular movements are full. Visual fields are full. The tongue is midline, and the patient has symmetric elevation of the soft palate. No obvious hearing deficits are noted.  A head and  neck cytocide tremor is noted, a jaw tremor is noted.  Motor: The motor testing reveals 5 over 5 strength of all 4 extremities. Good symmetric motor tone is noted throughout.  Sensory: Sensory testing is intact to pinprick, soft touch, vibration sensation, and position sense on all 4 extremities. No evidence of extinction is noted.  Coordination: Cerebellar testing reveals good finger-nose-finger and heel-to-shin bilaterally.  Significant intention tremor is noted with finger-nose-finger bilaterally.  Gait and station: Gait is wide-based, the patient can walk independently.. Tandem gait is unsteady, the patient could not perform. Romberg is negative. No drift is seen.  Reflexes: Deep tendon reflexes are symmetric and normal bilaterally. Toes are downgoing bilaterally.   Assessment/Plan:  1.  Essential tremor  2.  Gait disturbance  The patient clearly has what appears to be an essential tremor with a prominent family history.  The patient will be placed on low-dose propranolol, she will call in several weeks if she is tolerating the drug, we will go up on the dosing.  The patient will follow-up in about 6 months.  Marlan Palau MD 06/22/2018 9:44 AM  [facility] [address] [place], [place] [address]  Phone [phone] Fax [phone]

## 2018-07-14 DIAGNOSIS — H2512 Age-related nuclear cataract, left eye: Secondary | ICD-10-CM | POA: Diagnosis not present

## 2018-07-14 DIAGNOSIS — H25812 Combined forms of age-related cataract, left eye: Secondary | ICD-10-CM | POA: Diagnosis not present

## 2018-08-07 ENCOUNTER — Telehealth: Payer: Self-pay | Admitting: Neurology

## 2018-08-07 NOTE — Telephone Encounter (Signed)
Pts daughter Zella Ball is calling in stating patient wants to know if her propranolol (INDERAL) 20 MG tablet can be incresed due to not helping with tremors

## 2018-08-07 NOTE — Telephone Encounter (Addendum)
I contacted the pt's daughter and advised I received her message and would discuss with Dr. Anne Hahn on 08/09/18 once he returns about this message Daughter was agreeable and stated the best call back # is [phone].

## 2018-08-09 MED ORDER — PROPRANOLOL HCL 40 MG PO TABS: 40.0000 mg | ORAL_TABLET | Freq: Three times a day (TID) | ORAL | 3 refills | Status: DC

## 2018-08-09 NOTE — Telephone Encounter (Signed)
I called the patient, talk with the daughter.  The patient has had some increase in tremor over the last week.  Initially, she seemed to get benefit with the propranolol.  She does check her blood pressures, they oftentimes are somewhat elevated.  I will increase the propranolol taking 40 mg twice daily, they are to contact me if the pulse goes below 60.

## 2018-08-17 MED ORDER — PROPRANOLOL HCL 20 MG PO TABS: 20.0000 mg | ORAL_TABLET | Freq: Two times a day (BID) | ORAL | 3 refills | Status: DC

## 2018-08-17 MED ORDER — GABAPENTIN 100 MG PO CAPS: 100.0000 mg | ORAL_CAPSULE | Freq: Three times a day (TID) | ORAL | 3 refills | Status: DC

## 2018-08-17 NOTE — Telephone Encounter (Signed)
I called the daughter, the patient is not able to tolerate the 40 mg twice daily dose of propranolol as she is feeling fatigued on the medication.  Her heart rate goes down to 55-60.  She is now back on 20 mg twice daily.  We will add low-dose gabapentin for the tremor taking 100 mg 3 times daily.

## 2018-08-17 NOTE — Addendum Note (Signed)
Addended by: York Spaniel on: 08/17/2018 05:06 PM   Modules accepted: Orders

## 2018-08-17 NOTE — Telephone Encounter (Signed)
I called the daughter, left a message, I will call back later. 

## 2018-08-17 NOTE — Telephone Encounter (Signed)
Pts daughter Tonya Villa called in and stated pts BP has been dropping below 60 , and she has been laying around a lot , and also the shaking is still dramatically there and she wants to know what other options they have.

## 2018-08-17 NOTE — Telephone Encounter (Signed)
I contacted the pt's daughter Zella Ball ( ok per DPR) to discuss this message. Daughter states the pt's BP was not dropping below 60, but her HR has been.  She states the most recent BP was 115 75.  I advised I would fwd to Dr. Anne Hahn to advise and pt's daughter was agreeable.

## 2018-09-11 DIAGNOSIS — H25811 Combined forms of age-related cataract, right eye: Secondary | ICD-10-CM | POA: Diagnosis not present

## 2018-09-11 DIAGNOSIS — H2511 Age-related nuclear cataract, right eye: Secondary | ICD-10-CM | POA: Diagnosis not present

## 2018-09-20 DIAGNOSIS — I10 Essential (primary) hypertension: Secondary | ICD-10-CM | POA: Diagnosis not present

## 2018-09-20 DIAGNOSIS — Z6827 Body mass index (BMI) 27.0-27.9, adult: Secondary | ICD-10-CM | POA: Diagnosis not present

## 2018-09-20 DIAGNOSIS — F411 Generalized anxiety disorder: Secondary | ICD-10-CM | POA: Diagnosis not present

## 2018-09-20 DIAGNOSIS — R2681 Unsteadiness on feet: Secondary | ICD-10-CM | POA: Diagnosis not present

## 2018-10-15 ENCOUNTER — Other Ambulatory Visit: Payer: Self-pay | Admitting: Neurology

## 2018-10-18 DIAGNOSIS — H524 Presbyopia: Secondary | ICD-10-CM | POA: Diagnosis not present

## 2018-10-18 DIAGNOSIS — H3589 Other specified retinal disorders: Secondary | ICD-10-CM | POA: Diagnosis not present

## 2018-12-21 ENCOUNTER — Telehealth: Payer: Self-pay

## 2018-12-21 NOTE — Telephone Encounter (Signed)
Unable to get in contact with the patient. LVM letting her know that her appt has been cancelled and she will need to call back to r/s. Office number was provided.

## 2018-12-25 ENCOUNTER — Ambulatory Visit: Payer: Medicare Other | Admitting: Neurology

## 2019-02-20 DIAGNOSIS — Z23 Encounter for immunization: Secondary | ICD-10-CM | POA: Diagnosis not present

## 2019-05-16 NOTE — Progress Notes (Signed)
PATIENT: Tonya Villa DOB: 12/23/1926  REASON FOR VISIT: follow up HISTORY FROM: patient  HISTORY OF PRESENT ILLNESS: Today 05/17/19  Tonya Villa is a 86 year old female with history of essential tremor.  She has extensive family history of tremor.  She has a head and jaw tremor, along with vocal tremor.  She has tremor to both arms, more in the right.  She was placed on propanolol, was unable to tolerate 40 mg twice daily dosing.  She is now taking 20 mg twice a day, low-dose gabapentin was added.  She was unable to tolerate gabapentin due to feeling drowsy.  She is now taking propanolol 20 mg twice a day, and tolerating medication.  She feels it has been helpful with her tremor.  She has good and bad days.  She has not had recent fall.  She lives alone, is able to do her own daily activities, she does some cooking, but avoids cooking that would be dangerous. She says her handwriting is affected, but she is able to eat fairly well.  She presents today for evaluation accompanied by her daughter.  Repeat blood pressure is 160/70.  HISTORY 06/22/2018 Dr. Jannifer Franklin: Tonya Villa is an 86 year old right-handed white female with a history of an essential tremor.  The patient has had tremor to some degree over the last 5 years, she has multiple family members who has had tremor, her paternal grandmother, one brother and 1 sister, and her daughter have some tremor.  Most individuals have been in her 30s before the tremor has started.  The patient has within the last year developed a side to side head tremor and a jaw tremor, the tremor initially started 5 years ago with the right arm but spread to the left arm as well.  She now has some vocal tremor as well.  The patient has had to give up cooking because of the tremor, she finds it difficult to write or to feed herself.  The patient does have some gait instability, she has not had any falls, she will use a cane on occasion to ambulate.  She reports some  numbness in the hands, she feels "weak all over".  She is sent to this office for an evaluation.  REVIEW OF SYSTEMS: Out of a complete 14 system review of symptoms, the patient complains only of the following symptoms, and all other reviewed systems are negative.  Tremor  ALLERGIES: Allergies  Allergen Reactions  . Codeine Nausea And Vomiting    HOME MEDICATIONS: Outpatient Medications Prior to Visit  Medication Sig Dispense Refill  . acetaminophen (TYLENOL) 500 MG tablet Take 500 mg by mouth every 6 (six) hours as needed.    Marland Kitchen escitalopram (LEXAPRO) 5 MG tablet Take 5 mg by mouth daily.    Marland Kitchen lisinopril-hydrochlorothiazide (PRINZIDE,ZESTORETIC) 20-12.5 MG tablet TAKE 1/2 TABLET QD    . loratadine (ALLERGY) 10 MG tablet Take 10 mg by mouth daily.    . Multiple Vitamins-Minerals (MULTIVITAMIN ADULT PO) Take by mouth.    . mycophenolate (CELLCEPT) 250 MG capsule Take 250 mg by mouth daily.    . propranolol (INDERAL) 20 MG tablet TAKE 1 TABLET(20 MG) BY MOUTH TWICE DAILY 60 tablet 3  . gabapentin (NEURONTIN) 100 MG capsule Take 1 capsule (100 mg total) by mouth 3 (three) times daily. (Patient not taking: Reported on 05/17/2019) 90 capsule 3  . methylPREDNISolone (MEDROL) 4 MG tablet Take 4 mg by mouth daily.      No facility-administered medications prior  to visit.    PAST MEDICAL HISTORY: Past Medical History:  Diagnosis Date  . Hearing loss   . High blood pressure   . Tremor   . Tremor, essential 06/22/2018    PAST SURGICAL HISTORY: Past Surgical History:  Procedure Laterality Date  . ABDOMINAL HYSTERECTOMY    . SPINAL FUSION      FAMILY HISTORY: Family History  Problem Relation Age of Onset  . Alzheimer's disease Mother   . Tremor Sister   . Tremor Brother   . Tremor Paternal Grandmother   . Alzheimer's disease Sister     SOCIAL HISTORY: Social History   Socioeconomic History  . Marital status: Widowed    Spouse name: Not on file  . Number of children: 3  .  Years of education: Not on file  . Highest education level: 12th grade  Occupational History  . Not on file  Tobacco Use  . Smoking status: Never Smoker  . Smokeless tobacco: Never Used  Substance and Sexual Activity  . Alcohol use: Never  . Drug use: Never  . Sexual activity: Not on file  Other Topics Concern  . Not on file  Social History Narrative   No caffeine   Right handed    Lives home at home alone    Social Determinants of Health   Financial Resource Strain:   . Difficulty of Paying Living Expenses: Not on file  Food Insecurity:   . Worried About Charity fundraiser in the Last Year: Not on file  . Ran Out of Food in the Last Year: Not on file  Transportation Needs:   . Lack of Transportation (Medical): Not on file  . Lack of Transportation (Non-Medical): Not on file  Physical Activity:   . Days of Exercise per Week: Not on file  . Minutes of Exercise per Session: Not on file  Stress:   . Feeling of Stress : Not on file  Social Connections:   . Frequency of Communication with Friends and Family: Not on file  . Frequency of Social Gatherings with Friends and Family: Not on file  . Attends Religious Services: Not on file  . Active Member of Clubs or Organizations: Not on file  . Attends Archivist Meetings: Not on file  . Marital Status: Not on file  Intimate Partner Violence:   . Fear of Current or Ex-Partner: Not on file  . Emotionally Abused: Not on file  . Physically Abused: Not on file  . Sexually Abused: Not on file   PHYSICAL EXAM  Vitals:   05/17/19 1030  BP: (!) 198/82  Pulse: 73  Temp: 98.2 F (36.8 C)  Weight: 158 lb (71.7 kg)  Height: 5' (1.524 m)   Body mass index is 30.86 kg/m.  Generalized: Well developed, in no acute distress   Neurological examination  Mentation: Alert oriented to time, place, history taking. Follows all commands speech and language fluent Cranial nerve II-XII: Pupils were equal round reactive to light.  Extraocular movements were full, visual field were full on confrontational test. Facial sensation and strength were normal.  Head turning and shoulder shrug  were normal and symmetric.  A head and neck tremor is noted. Motor: The motor testing reveals 5 over 5 strength of all 4 extremities. Good symmetric motor tone is noted throughout.  Sensory: Sensory testing is intact to soft touch on all 4 extremities. No evidence of extinction is noted.  Coordination: Cerebellar testing reveals good finger-nose-finger and heel-to-shin bilaterally.  Significant intention tremor, right greater than left. Gait and station: Able to rise from seated position, gait is wide-based, steady, can walk independently. Reflexes: Deep tendon reflexes are symmetric and normal bilaterally.   DIAGNOSTIC DATA (LABS, IMAGING, TESTING) - I reviewed patient records, labs, notes, testing and imaging myself where available.  Lab Results  Component Value Date   WBC 11.2 (H) 08/11/2007   HGB 13.3 08/11/2007   HCT 38.3 08/11/2007   MCV 94.6 08/11/2007   PLT 246 08/11/2007      Component Value Date/Time   NA 138 08/08/2007 0949   K 4.2 08/08/2007 0949   CL 97 08/08/2007 0949   CO2 29 08/08/2007 0949   GLUCOSE 100 (H) 08/08/2007 0949   BUN 10 08/08/2007 0949   CREATININE 0.61 08/08/2007 0949   CALCIUM 9.5 08/08/2007 0949   PROT 5.9 (L) 08/08/2007 0949   ALBUMIN 3.0 (L) 08/08/2007 0949   AST 30 08/08/2007 0949   ALT 33 08/08/2007 0949   ALKPHOS 63 08/08/2007 0949   BILITOT 0.7 08/08/2007 0949   GFRNONAA >60 08/08/2007 0949   GFRAA  08/08/2007 0949    >60        The eGFR has been calculated using the MDRD equation. This calculation has not been validated in all clinical   No results found for: CHOL, HDL, LDLCALC, LDLDIRECT, TRIG, CHOLHDL No results found for: HGBA1C No results found for: VITAMINB12 No results found for: TSH    ASSESSMENT AND PLAN 86 y.o. year old female  has a past medical history of Hearing  loss, High blood pressure, Tremor, and Tremor, essential (06/22/2018). here with:  1.  Essential tremor  She was unable to tolerate propanolol 40 mg twice daily, I will switch her to Inderal LA 60 mg daily.  She was unable to tolerate gabapentin.  She will check her blood pressure and heart rate at home, let me know if the heart rate goes below 60.  She will follow-up in 6 months or sooner if needed.  I did advise if her symptoms worsen or she develops any new symptoms she should let us know.   I spent 15 minutes with the patient. 50% of this time was spent seeing her plan of care.   Butler Denmark, AGNP-C, DNP 05/17/2019, 10:57 AM [facility] [address], [place], [place] [address] [phone]

## 2019-05-17 ENCOUNTER — Ambulatory Visit (INDEPENDENT_AMBULATORY_CARE_PROVIDER_SITE_OTHER): Payer: Medicare Other | Admitting: Neurology

## 2019-05-17 ENCOUNTER — Encounter: Payer: Self-pay | Admitting: Neurology

## 2019-05-17 ENCOUNTER — Other Ambulatory Visit: Payer: Self-pay

## 2019-05-17 VITALS — BP 198/82 | HR 73 | Temp 98.2°F | Ht 60.0 in | Wt 158.0 lb

## 2019-05-17 DIAGNOSIS — G250 Essential tremor: Secondary | ICD-10-CM

## 2019-05-17 MED ORDER — PROPRANOLOL HCL ER 60 MG PO CP24: 60.0000 mg | ORAL_CAPSULE | Freq: Every day | ORAL | 3 refills | Status: DC

## 2019-05-17 NOTE — Progress Notes (Signed)
I have read the note, and I agree with the clinical assessment and plan.  Tonya Villa   

## 2019-05-17 NOTE — Patient Instructions (Signed)
Switch to propanolol long acting preparation, 60 mg daily, check  Your HR and BP at home, let me know if you heart rate is less than 60.  Return in 6 months or sooner if needed

## 2019-05-21 DIAGNOSIS — Z79899 Other long term (current) drug therapy: Secondary | ICD-10-CM | POA: Diagnosis not present

## 2019-05-21 DIAGNOSIS — R5383 Other fatigue: Secondary | ICD-10-CM | POA: Diagnosis not present

## 2019-05-21 DIAGNOSIS — Z1331 Encounter for screening for depression: Secondary | ICD-10-CM | POA: Diagnosis not present

## 2019-05-21 DIAGNOSIS — I10 Essential (primary) hypertension: Secondary | ICD-10-CM | POA: Diagnosis not present

## 2019-05-21 DIAGNOSIS — Z0000 Encounter for general adult medical examination without abnormal findings: Secondary | ICD-10-CM | POA: Diagnosis not present

## 2019-05-21 DIAGNOSIS — E559 Vitamin D deficiency, unspecified: Secondary | ICD-10-CM | POA: Diagnosis not present

## 2019-05-24 ENCOUNTER — Ambulatory Visit: Payer: Self-pay

## 2019-05-30 DIAGNOSIS — N049 Nephrotic syndrome with unspecified morphologic changes: Secondary | ICD-10-CM | POA: Diagnosis not present

## 2019-05-30 DIAGNOSIS — N390 Urinary tract infection, site not specified: Secondary | ICD-10-CM | POA: Diagnosis not present

## 2019-06-01 DIAGNOSIS — N049 Nephrotic syndrome with unspecified morphologic changes: Secondary | ICD-10-CM | POA: Diagnosis not present

## 2019-06-01 DIAGNOSIS — I10 Essential (primary) hypertension: Secondary | ICD-10-CM | POA: Diagnosis not present

## 2019-06-01 DIAGNOSIS — K5900 Constipation, unspecified: Secondary | ICD-10-CM | POA: Diagnosis not present

## 2019-06-01 DIAGNOSIS — Z6828 Body mass index (BMI) 28.0-28.9, adult: Secondary | ICD-10-CM | POA: Diagnosis not present

## 2019-06-01 DIAGNOSIS — N390 Urinary tract infection, site not specified: Secondary | ICD-10-CM | POA: Diagnosis not present

## 2019-06-02 ENCOUNTER — Ambulatory Visit: Payer: Medicare Other | Attending: Internal Medicine

## 2019-06-02 DIAGNOSIS — Z23 Encounter for immunization: Secondary | ICD-10-CM | POA: Insufficient documentation

## 2019-06-02 NOTE — Progress Notes (Signed)
   Covid-19 Vaccination Clinic  Name:  DALAYA SUPPA    MRN: [id number] DOB: 24-Feb-1927  06/02/2019  Ms. Robeck was observed post Covid-19 immunization for 15 minutes without incidence. She was provided with Vaccine Information Sheet and instruction to access the V-Safe system.   Ms. Sloniker was instructed to call 911 with any severe reactions post vaccine: Marland Kitchen Difficulty breathing  . Swelling of your face and throat  . A fast heartbeat  . A bad rash all over your body  . Dizziness and weakness    Immunizations Administered    Name Date Dose VIS Date Route   Pfizer COVID-19 Vaccine 06/02/2019  8:23 AM 0.3 mL 04/06/2019 Intramuscular   Manufacturer: [employer], [employer]   Lot: [id number]   NDC: [address]-5

## 2019-06-26 ENCOUNTER — Ambulatory Visit: Payer: Medicare Other

## 2019-06-26 ENCOUNTER — Ambulatory Visit: Payer: Medicare Other | Attending: Internal Medicine

## 2019-06-26 DIAGNOSIS — Z23 Encounter for immunization: Secondary | ICD-10-CM | POA: Insufficient documentation

## 2019-06-26 NOTE — Progress Notes (Signed)
   Covid-19 Vaccination Clinic  Name:  Tonya Villa    MRN: [id number] DOB: [birth year]  06/26/2019  Tonya Villa was observed post Covid-19 immunization for 15 minutes without incident. She was provided with Vaccine Information Sheet and instruction to access the V-Safe system.   Tonya Villa was instructed to call 911 with any severe reactions post vaccine: Marland Kitchen Difficulty breathing  . Swelling of face and throat  . A fast heartbeat  . A bad rash all over body  . Dizziness and weakness   Immunizations Administered    Name Date Dose VIS Date Route   Pfizer COVID-19 Vaccine 06/26/2019  1:56 PM 0.3 mL 04/06/2019 Intramuscular   Manufacturer: [employer], [employer]   Lot: [id number]   NDC: [address]-9

## 2019-09-26 ENCOUNTER — Other Ambulatory Visit: Payer: Self-pay | Admitting: Neurology

## 2019-11-14 ENCOUNTER — Encounter: Payer: Self-pay | Admitting: Neurology

## 2019-11-14 ENCOUNTER — Ambulatory Visit (INDEPENDENT_AMBULATORY_CARE_PROVIDER_SITE_OTHER): Payer: Medicare Other | Admitting: Neurology

## 2019-11-14 VITALS — BP 158/62 | HR 62 | Ht 60.0 in | Wt 149.0 lb

## 2019-11-14 DIAGNOSIS — G250 Essential tremor: Secondary | ICD-10-CM

## 2019-11-14 MED ORDER — PROPRANOLOL HCL ER 60 MG PO CP24: 60.0000 mg | ORAL_CAPSULE | Freq: Every day | ORAL | 3 refills | Status: DC

## 2019-11-14 NOTE — Patient Instructions (Signed)
Continue with current dosing of Propranolol  Continue seeing your primary doctor See you back in 8 months

## 2019-11-14 NOTE — Progress Notes (Signed)
PATIENT: Tonya Villa DOB: January 29, 1927  REASON FOR VISIT: follow up HISTORY FROM: patient  HISTORY OF PRESENT ILLNESS: Today 11/14/19 Tonya Villa is a 86 year old female with history of essential tremor.  She has extensive family history of tremor.  She has a head and jaw tremor, along with vocal tremor.  She has tremors of both arms, greater in the right.  She was unable to tolerate higher dose propanolol, is now taking Inderal LA 60 mg daily.  She could not tolerate gabapentin due to feeling drowsy.  Tremor is overall stable. Does affect her handwriting, and eating but she manages ok.  She lives alone and does drive a car.  Her daughter checks in on her.  Denies any new problems or concerns.  No recent falls.  Presents today for follow-up accompanied by her daughter.  She drives HISTORY 2/67/1245 SS: Tonya Villa is a 86 year old female with history of essential tremor.  She has extensive family history of tremor.  She has a head and jaw tremor, along with vocal tremor.  She has tremor to both arms, more in the right.  She was placed on propanolol, was unable to tolerate 40 mg twice daily dosing.  She is now taking 20 mg twice a day, low-dose gabapentin was added.  She was unable to tolerate gabapentin due to feeling drowsy.  She is now taking propanolol 20 mg twice a day, and tolerating medication.  She feels it has been helpful with her tremor.  She has good and bad days.  She has not had recent fall.  She lives alone, is able to do her own daily activities, she does some cooking, but avoids cooking that would be dangerous. She says her handwriting is affected, but she is able to eat fairly well.  She presents today for evaluation accompanied by her daughter.  Repeat blood pressure is 160/70.   REVIEW OF SYSTEMS: Out of a complete 14 system review of symptoms, the patient complains only of the following symptoms, and all other reviewed systems are  negative.  Tremor  ALLERGIES: Allergies  Allergen Reactions  . Codeine Nausea And Vomiting    HOME MEDICATIONS: Outpatient Medications Prior to Visit  Medication Sig Dispense Refill  . acetaminophen (TYLENOL) 500 MG tablet Take 500 mg by mouth every 6 (six) hours as needed.    Marland Kitchen escitalopram (LEXAPRO) 5 MG tablet Take 5 mg by mouth daily.    Marland Kitchen lisinopril-hydrochlorothiazide (PRINZIDE,ZESTORETIC) 20-12.5 MG tablet TAKE 1/2 TABLET QD    . loratadine (ALLERGY) 10 MG tablet Take 10 mg by mouth daily.    . methylPREDNISolone (MEDROL) 4 MG tablet Take 4 mg by mouth daily.     . Multiple Vitamins-Minerals (MULTIVITAMIN ADULT PO) Take by mouth.    . mycophenolate (CELLCEPT) 250 MG capsule Take 250 mg by mouth daily.    Marland Kitchen gabapentin (NEURONTIN) 100 MG capsule Take 1 capsule (100 mg total) by mouth 3 (three) times daily. (Patient not taking: Reported on 05/17/2019) 90 capsule 3  . propranolol ER (INDERAL LA) 60 MG 24 hr capsule Take 1 capsule (60 mg total) by mouth daily. 60 capsule 3   No facility-administered medications prior to visit.    PAST MEDICAL HISTORY: Past Medical History:  Diagnosis Date  . Hearing loss   . High blood pressure   . Tremor   . Tremor, essential 06/22/2018    PAST SURGICAL HISTORY: Past Surgical History:  Procedure Laterality Date  . ABDOMINAL HYSTERECTOMY    .  SPINAL FUSION      FAMILY HISTORY: Family History  Problem Relation Age of Onset  . Alzheimer's disease Mother   . Tremor Sister   . Tremor Brother   . Tremor Paternal Grandmother   . Alzheimer's disease Sister     SOCIAL HISTORY: Social History   Socioeconomic History  . Marital status: Widowed    Spouse name: Not on file  . Number of children: 3  . Years of education: Not on file  . Highest education level: 12th grade  Occupational History  . Not on file  Tobacco Use  . Smoking status: Never Smoker  . Smokeless tobacco: Never Used  Vaping Use  . Vaping Use: Never used   Substance and Sexual Activity  . Alcohol use: Never  . Drug use: Never  . Sexual activity: Not on file  Other Topics Concern  . Not on file  Social History Narrative   No caffeine   Right handed    Lives home at home alone    Social Determinants of Health   Financial Resource Strain:   . Difficulty of Paying Living Expenses:   Food Insecurity:   . Worried About Charity fundraiser in the Last Year:   . Arboriculturist in the Last Year:   Transportation Needs:   . Film/video editor (Medical):   Marland Kitchen Lack of Transportation (Non-Medical):   Physical Activity:   . Days of Exercise per Week:   . Minutes of Exercise per Session:   Stress:   . Feeling of Stress :   Social Connections:   . Frequency of Communication with Friends and Family:   . Frequency of Social Gatherings with Friends and Family:   . Attends Religious Services:   . Active Member of Clubs or Organizations:   . Attends Archivist Meetings:   Marland Kitchen Marital Status:   Intimate Partner Violence:   . Fear of Current or Ex-Partner:   . Emotionally Abused:   Marland Kitchen Physically Abused:   . Sexually Abused:    PHYSICAL EXAM  Vitals:   11/14/19 1100  BP: (!) 158/62  Pulse: 62  Weight: 149 lb (67.6 kg)  Height: 5' (1.524 m)   Body mass index is 29.1 kg/m.  Generalized: Well developed, in no acute distress   Neurological examination  Mentation: Alert oriented to time, place, history taking. Follows all commands speech and language fluent, vocal tremor noted Cranial nerve II-XII: Pupils were equal round reactive to light. Extraocular movements were full, visual field were full on confrontational test. Facial sensation and strength were normal. Head turning and shoulder shrug were normal and symmetric.  Head and neck tremors noted. Motor: The motor testing reveals 5 over 5 strength of all 4 extremities. Good symmetric motor tone is noted throughout.  Sensory: Sensory testing is intact to soft touch on all 4  extremities. No evidence of extinction is noted.  Coordination: Difficulty performing finger-nose-finger on the right due to tremor, does fairly well with the left Gait and station: Gait wide-based, slight forward leaning, can walk independently Reflexes: Deep tendon reflexes are symmetric and normal bilaterally.   DIAGNOSTIC DATA (LABS, IMAGING, TESTING) - I reviewed patient records, labs, notes, testing and imaging myself where available.  Lab Results  Component Value Date   WBC 11.2 (H) 08/11/2007   HGB 13.3 08/11/2007   HCT 38.3 08/11/2007   MCV 94.6 08/11/2007   PLT 246 08/11/2007      Component Value Date/Time  NA 138 08/08/2007 0949   K 4.2 08/08/2007 0949   CL 97 08/08/2007 0949   CO2 29 08/08/2007 0949   GLUCOSE 100 (H) 08/08/2007 0949   BUN 10 08/08/2007 0949   CREATININE 0.61 08/08/2007 0949   CALCIUM 9.5 08/08/2007 0949   PROT 5.9 (L) 08/08/2007 0949   ALBUMIN 3.0 (L) 08/08/2007 0949   AST 30 08/08/2007 0949   ALT 33 08/08/2007 0949   ALKPHOS 63 08/08/2007 0949   BILITOT 0.7 08/08/2007 0949   GFRNONAA >60 08/08/2007 0949   GFRAA  08/08/2007 0949    >60        The eGFR has been calculated using the MDRD equation. This calculation has not been validated in all clinical   No results found for: CHOL, HDL, LDLCALC, LDLDIRECT, TRIG, CHOLHDL No results found for: HGBA1C No results found for: VITAMINB12 No results found for: TSH   ASSESSMENT AND PLAN 86 y.o. year old female  has a past medical history of Hearing loss, High blood pressure, Tremor, and Tremor, essential (06/22/2018). here with:  1.  Essential tremor  Tremor is overall stable.  She will remain on Inderal LA 60 mg daily, could not tolerate higher doses.  She could not tolerate gabapentin.  We talked about considering weighted utensils to help with eating.  She will continue follow-up with primary doctor, follow-up here in 8 months or sooner if needed.  I spent 20 minutes of face-to-face and  non-face-to-face time with patient.  This included previsit chart review, lab review, study review, order entry, electronic health record documentation, patient education.  Butler Denmark, AGNP-C, DNP 11/14/2019, 11:36 AM [facility] [address] [place], [place] [address] [phone]

## 2019-11-14 NOTE — Progress Notes (Signed)
I have read the note, and I agree with the clinical assessment and plan.  Carle Dargan K Nashaly Dorantes   

## 2019-12-24 DIAGNOSIS — I10 Essential (primary) hypertension: Secondary | ICD-10-CM | POA: Diagnosis not present

## 2019-12-24 DIAGNOSIS — N049 Nephrotic syndrome with unspecified morphologic changes: Secondary | ICD-10-CM | POA: Diagnosis not present

## 2019-12-24 DIAGNOSIS — E559 Vitamin D deficiency, unspecified: Secondary | ICD-10-CM | POA: Diagnosis not present

## 2019-12-24 DIAGNOSIS — E890 Postprocedural hypothyroidism: Secondary | ICD-10-CM | POA: Diagnosis not present

## 2019-12-24 DIAGNOSIS — Z8639 Personal history of other endocrine, nutritional and metabolic disease: Secondary | ICD-10-CM | POA: Diagnosis not present

## 2019-12-24 DIAGNOSIS — F411 Generalized anxiety disorder: Secondary | ICD-10-CM | POA: Diagnosis not present

## 2019-12-24 DIAGNOSIS — Z79899 Other long term (current) drug therapy: Secondary | ICD-10-CM | POA: Diagnosis not present

## 2019-12-24 DIAGNOSIS — E7800 Pure hypercholesterolemia, unspecified: Secondary | ICD-10-CM | POA: Diagnosis not present

## 2020-01-22 DIAGNOSIS — F411 Generalized anxiety disorder: Secondary | ICD-10-CM | POA: Diagnosis not present

## 2020-01-22 DIAGNOSIS — I10 Essential (primary) hypertension: Secondary | ICD-10-CM | POA: Diagnosis not present

## 2020-01-22 DIAGNOSIS — Z6826 Body mass index (BMI) 26.0-26.9, adult: Secondary | ICD-10-CM | POA: Diagnosis not present

## 2020-04-08 DIAGNOSIS — Z961 Presence of intraocular lens: Secondary | ICD-10-CM | POA: Diagnosis not present

## 2020-04-08 DIAGNOSIS — H3589 Other specified retinal disorders: Secondary | ICD-10-CM | POA: Diagnosis not present

## 2020-04-08 DIAGNOSIS — H524 Presbyopia: Secondary | ICD-10-CM | POA: Diagnosis not present

## 2020-07-16 ENCOUNTER — Ambulatory Visit: Payer: Medicare Other | Admitting: Neurology

## 2020-08-22 DIAGNOSIS — I10 Essential (primary) hypertension: Secondary | ICD-10-CM | POA: Diagnosis not present

## 2020-08-22 DIAGNOSIS — Z6826 Body mass index (BMI) 26.0-26.9, adult: Secondary | ICD-10-CM | POA: Diagnosis not present

## 2020-08-22 DIAGNOSIS — Z0000 Encounter for general adult medical examination without abnormal findings: Secondary | ICD-10-CM | POA: Diagnosis not present

## 2020-08-22 DIAGNOSIS — E7800 Pure hypercholesterolemia, unspecified: Secondary | ICD-10-CM | POA: Diagnosis not present

## 2020-08-22 DIAGNOSIS — Z79899 Other long term (current) drug therapy: Secondary | ICD-10-CM | POA: Diagnosis not present

## 2020-08-22 DIAGNOSIS — Z1331 Encounter for screening for depression: Secondary | ICD-10-CM | POA: Diagnosis not present

## 2020-11-03 ENCOUNTER — Ambulatory Visit (INDEPENDENT_AMBULATORY_CARE_PROVIDER_SITE_OTHER): Payer: Medicare Other | Admitting: Neurology

## 2020-11-03 ENCOUNTER — Encounter: Payer: Self-pay | Admitting: Neurology

## 2020-11-03 VITALS — BP 185/76 | HR 76 | Ht 61.0 in | Wt 157.5 lb

## 2020-11-03 DIAGNOSIS — G250 Essential tremor: Secondary | ICD-10-CM

## 2020-11-03 MED ORDER — PROPRANOLOL HCL ER 60 MG PO CP24: 60.0000 mg | ORAL_CAPSULE | Freq: Every day | ORAL | 3 refills | Status: DC

## 2020-11-03 NOTE — Patient Instructions (Signed)
Will continue propranolol at current dosing Continue to see primary care, follow-up here as needed

## 2020-11-03 NOTE — Progress Notes (Signed)
I have read the note, and I agree with the clinical assessment and plan.  Maame Dack K Jynesis Nakamura   

## 2020-11-03 NOTE — Progress Notes (Signed)
PATIENT: Tonya Villa DOB: 1927-04-17  REASON FOR VISIT: follow up HISTORY FROM: patient Primary Neurologist: Dr. Jannifer Franklin  HISTORY OF PRESENT ILLNESS: Today 11/03/20 Tonya Villa is a 87 year old female with history of essential tremor. Has head, jaw, vocal, bilateral arm tremor (R > L). On Inderal LA 60 mg daily, cannot tolerate higher doses, gabapentin made her feel drowsy.  Tremor remains overall stable, affects handwriting, eating, is embarrassing. Strong family history.  Lives alone, has given up driving.  Denies any changes to her health.  Here today accompanied by her daughter, Tonya Villa.  No falls. BP up today, just took meds before leaving house.   Update 11/14/2019 SS: Tonya Villa is a 87 year old female with history of essential tremor.  She has extensive family history of tremor.  She has a head and jaw tremor, along with vocal tremor.  She has tremors of both arms, greater in the right.  She was unable to tolerate higher dose propanolol, is now taking Inderal LA 60 mg daily.  She could not tolerate gabapentin due to feeling drowsy.  Tremor is overall stable. Does affect her handwriting, and eating but she manages ok.  She lives alone and does drive a car.  Her daughter checks in on her.  Denies any new problems or concerns.  No recent falls.  Presents today for follow-up accompanied by her daughter.  HISTORY 05/17/2019 SS: Tonya Villa is a 87 year old female with history of essential tremor.  She has extensive family history of tremor.  She has a head and jaw tremor, along with vocal tremor.  She has tremor to both arms, more in the right.  She was placed on propanolol, was unable to tolerate 40 mg twice daily dosing.  She is now taking 20 mg twice a day, low-dose gabapentin was added.  She was unable to tolerate gabapentin due to feeling drowsy.  She is now taking propanolol 20 mg twice a day, and tolerating medication.  She feels it has been helpful with her tremor.  She has good  and bad days.  She has not had recent fall.  She lives alone, is able to do her own daily activities, she does some cooking, but avoids cooking that would be dangerous. She says her handwriting is affected, but she is able to eat fairly well.  She presents today for evaluation accompanied by her daughter.  Repeat blood pressure is 160/70.   REVIEW OF SYSTEMS: Out of a complete 14 system review of symptoms, the patient complains only of the following symptoms, and all other reviewed systems are negative.  Tremor  ALLERGIES: Allergies  Allergen Reactions   Codeine Nausea And Vomiting    HOME MEDICATIONS: Outpatient Medications Prior to Visit  Medication Sig Dispense Refill   acetaminophen (TYLENOL) 500 MG tablet Take 500 mg by mouth every 6 (six) hours as needed.     escitalopram (LEXAPRO) 5 MG tablet Take 5 mg by mouth daily.     lisinopril-hydrochlorothiazide (PRINZIDE,ZESTORETIC) 20-12.5 MG tablet TAKE 1/2 TABLET QD     loratadine (CLARITIN) 10 MG tablet Take 10 mg by mouth daily as needed.     Multiple Vitamins-Minerals (MULTIVITAMIN ADULT PO) Take by mouth.     mycophenolate (CELLCEPT) 250 MG capsule Take 250 mg by mouth daily.     propranolol ER (INDERAL LA) 60 MG 24 hr capsule Take 1 capsule (60 mg total) by mouth daily. 60 capsule 3   methylPREDNISolone (MEDROL) 4 MG tablet Take 4 mg by mouth daily.  No facility-administered medications prior to visit.    PAST MEDICAL HISTORY: Past Medical History:  Diagnosis Date   Hearing loss    High blood pressure    Tremor    Tremor, essential 06/22/2018    PAST SURGICAL HISTORY: Past Surgical History:  Procedure Laterality Date   ABDOMINAL HYSTERECTOMY     SPINAL FUSION      FAMILY HISTORY: Family History  Problem Relation Age of Onset   Alzheimer's disease Mother    Tremor Sister    Tremor Brother    Tremor Paternal Grandmother    Alzheimer's disease Sister     SOCIAL HISTORY: Social History   Socioeconomic  History   Marital status: Widowed    Spouse name: Not on file   Number of children: 3   Years of education: Not on file   Highest education level: 12th grade  Occupational History   Not on file  Tobacco Use   Smoking status: Never   Smokeless tobacco: Never  Vaping Use   Vaping Use: Never used  Substance and Sexual Activity   Alcohol use: Never   Drug use: Never   Sexual activity: Not on file  Other Topics Concern   Not on file  Social History Narrative   No caffeine   Right handed    Lives home at home alone    Social Determinants of Health   Financial Resource Strain: Not on file  Food Insecurity: Not on file  Transportation Needs: Not on file  Physical Activity: Not on file  Stress: Not on file  Social Connections: Not on file  Intimate Partner Violence: Not on file   PHYSICAL EXAM  Vitals:   11/03/20 0808  BP: (!) 185/76  Pulse: 76  Weight: 157 lb 8 oz (71.4 kg)  Height: 5' 1"  (1.549 m)    Body mass index is 29.76 kg/m.  Generalized: Well developed, in no acute distress   Neurological examination  Mentation: Alert oriented to time, place, history taking. Follows all commands speech and language fluent, vocal tremor noted Cranial nerve II-XII: Pupils were equal round reactive to light. Extraocular movements were full, visual field were full on confrontational test. Facial sensation and strength were normal. Head turning and shoulder shrug were normal and symmetric.  Head and neck tremors noted, jaw.  Motor: Good strength extremities Sensory: Sensory testing is intact to soft touch on all 4 extremities. No evidence of extinction is noted.  Coordination: Difficulty performing finger-nose-finger on the right due to tremor, does fairly well with the left, unable to perform handwriting sample, significant tremor with right hand Gait and station: Gait wide-based, forward leaning, independent Reflexes: Deep tendon reflexes are symmetric and normal bilaterally.    DIAGNOSTIC DATA (LABS, IMAGING, TESTING) - I reviewed patient records, labs, notes, testing and imaging myself where available.  Lab Results  Component Value Date   WBC 11.2 (H) 08/11/2007   HGB 13.3 08/11/2007   HCT 38.3 08/11/2007   MCV 94.6 08/11/2007   PLT 246 08/11/2007      Component Value Date/Time   NA 138 08/08/2007 0949   K 4.2 08/08/2007 0949   CL 97 08/08/2007 0949   CO2 29 08/08/2007 0949   GLUCOSE 100 (H) 08/08/2007 0949   BUN 10 08/08/2007 0949   CREATININE 0.61 08/08/2007 0949   CALCIUM 9.5 08/08/2007 0949   PROT 5.9 (L) 08/08/2007 0949   ALBUMIN 3.0 (L) 08/08/2007 0949   AST 30 08/08/2007 0949   ALT 33 08/08/2007 0949  ALKPHOS 63 08/08/2007 0949   BILITOT 0.7 08/08/2007 0949   GFRNONAA >60 08/08/2007 0949   GFRAA  08/08/2007 0949    >60        The eGFR has been calculated using the MDRD equation. This calculation has not been validated in all clinical   No results found for: CHOL, HDL, LDLCALC, LDLDIRECT, TRIG, CHOLHDL No results found for: HGBA1C No results found for: VITAMINB12 No results found for: TSH   ASSESSMENT AND PLAN 87 y.o. year old female  has a past medical history of Hearing loss, High blood pressure, Tremor, and Tremor, essential (06/22/2018). here with:  1.  Essential tremor  -Tremor remains stable, strong family history  -Continue Inderal LA 60 mg daily, unable to tolerate higher doses, have tried gabapentin, resulted in drowsiness -Will continue to follow with PCP, return here on an as-needed basis  Tonya Dakin, DNP 11/03/2020, 8:14 AM [facility] [address] [place], [place] [address] [phone]

## 2021-01-25 DIAGNOSIS — R778 Other specified abnormalities of plasma proteins: Secondary | ICD-10-CM | POA: Diagnosis not present

## 2021-01-25 DIAGNOSIS — R55 Syncope and collapse: Secondary | ICD-10-CM | POA: Diagnosis not present

## 2021-01-25 DIAGNOSIS — N390 Urinary tract infection, site not specified: Secondary | ICD-10-CM | POA: Diagnosis not present

## 2021-01-25 DIAGNOSIS — Z79899 Other long term (current) drug therapy: Secondary | ICD-10-CM | POA: Diagnosis not present

## 2021-01-25 DIAGNOSIS — R918 Other nonspecific abnormal finding of lung field: Secondary | ICD-10-CM | POA: Diagnosis not present

## 2021-01-25 DIAGNOSIS — R5381 Other malaise: Secondary | ICD-10-CM | POA: Diagnosis present

## 2021-01-25 DIAGNOSIS — M6282 Rhabdomyolysis: Secondary | ICD-10-CM | POA: Diagnosis not present

## 2021-01-25 DIAGNOSIS — E860 Dehydration: Secondary | ICD-10-CM | POA: Diagnosis not present

## 2021-01-25 DIAGNOSIS — R4701 Aphasia: Secondary | ICD-10-CM | POA: Diagnosis not present

## 2021-01-25 DIAGNOSIS — H905 Unspecified sensorineural hearing loss: Secondary | ICD-10-CM | POA: Diagnosis present

## 2021-01-25 DIAGNOSIS — I10 Essential (primary) hypertension: Secondary | ICD-10-CM | POA: Diagnosis present

## 2021-01-25 DIAGNOSIS — R7401 Elevation of levels of liver transaminase levels: Secondary | ICD-10-CM | POA: Diagnosis present

## 2021-01-25 DIAGNOSIS — S0083XA Contusion of other part of head, initial encounter: Secondary | ICD-10-CM | POA: Diagnosis not present

## 2021-01-25 DIAGNOSIS — F419 Anxiety disorder, unspecified: Secondary | ICD-10-CM | POA: Diagnosis present

## 2021-01-25 DIAGNOSIS — G9341 Metabolic encephalopathy: Secondary | ICD-10-CM | POA: Diagnosis not present

## 2021-01-25 DIAGNOSIS — N049 Nephrotic syndrome with unspecified morphologic changes: Secondary | ICD-10-CM | POA: Diagnosis not present

## 2021-01-25 DIAGNOSIS — E876 Hypokalemia: Secondary | ICD-10-CM | POA: Diagnosis not present

## 2021-01-29 DIAGNOSIS — R600 Localized edema: Secondary | ICD-10-CM | POA: Diagnosis not present

## 2021-01-29 DIAGNOSIS — D7389 Other diseases of spleen: Secondary | ICD-10-CM | POA: Diagnosis not present

## 2021-01-29 DIAGNOSIS — Z20822 Contact with and (suspected) exposure to covid-19: Secondary | ICD-10-CM | POA: Diagnosis not present

## 2021-01-29 DIAGNOSIS — R609 Edema, unspecified: Secondary | ICD-10-CM | POA: Diagnosis not present

## 2021-01-29 DIAGNOSIS — R531 Weakness: Secondary | ICD-10-CM | POA: Diagnosis not present

## 2021-01-29 DIAGNOSIS — E871 Hypo-osmolality and hyponatremia: Secondary | ICD-10-CM | POA: Diagnosis not present

## 2021-01-29 DIAGNOSIS — J9811 Atelectasis: Secondary | ICD-10-CM | POA: Diagnosis not present

## 2021-01-29 DIAGNOSIS — F419 Anxiety disorder, unspecified: Secondary | ICD-10-CM | POA: Diagnosis not present

## 2021-01-29 DIAGNOSIS — K8020 Calculus of gallbladder without cholecystitis without obstruction: Secondary | ICD-10-CM | POA: Diagnosis not present

## 2021-01-29 DIAGNOSIS — Z23 Encounter for immunization: Secondary | ICD-10-CM | POA: Diagnosis not present

## 2021-01-29 DIAGNOSIS — R930 Abnormal findings on diagnostic imaging of skull and head, not elsewhere classified: Secondary | ICD-10-CM | POA: Diagnosis not present

## 2021-01-29 DIAGNOSIS — R7401 Elevation of levels of liver transaminase levels: Secondary | ICD-10-CM | POA: Diagnosis not present

## 2021-01-29 DIAGNOSIS — N390 Urinary tract infection, site not specified: Secondary | ICD-10-CM | POA: Diagnosis not present

## 2021-01-29 DIAGNOSIS — Z043 Encounter for examination and observation following other accident: Secondary | ICD-10-CM | POA: Diagnosis not present

## 2021-01-29 DIAGNOSIS — I10 Essential (primary) hypertension: Secondary | ICD-10-CM | POA: Diagnosis not present

## 2021-01-29 DIAGNOSIS — G319 Degenerative disease of nervous system, unspecified: Secondary | ICD-10-CM | POA: Diagnosis not present

## 2021-01-29 DIAGNOSIS — I160 Hypertensive urgency: Secondary | ICD-10-CM | POA: Diagnosis not present

## 2021-01-29 DIAGNOSIS — N049 Nephrotic syndrome with unspecified morphologic changes: Secondary | ICD-10-CM | POA: Diagnosis not present

## 2021-01-29 DIAGNOSIS — R270 Ataxia, unspecified: Secondary | ICD-10-CM | POA: Diagnosis not present

## 2021-01-29 DIAGNOSIS — K5730 Diverticulosis of large intestine without perforation or abscess without bleeding: Secondary | ICD-10-CM | POA: Diagnosis not present

## 2021-01-29 DIAGNOSIS — S8000XA Contusion of unspecified knee, initial encounter: Secondary | ICD-10-CM | POA: Diagnosis not present

## 2021-01-30 DIAGNOSIS — I272 Pulmonary hypertension, unspecified: Secondary | ICD-10-CM | POA: Diagnosis not present

## 2021-01-30 DIAGNOSIS — I10 Essential (primary) hypertension: Secondary | ICD-10-CM | POA: Diagnosis not present

## 2021-01-30 DIAGNOSIS — N390 Urinary tract infection, site not specified: Secondary | ICD-10-CM | POA: Diagnosis not present

## 2021-01-30 DIAGNOSIS — E559 Vitamin D deficiency, unspecified: Secondary | ICD-10-CM | POA: Diagnosis not present

## 2021-01-30 DIAGNOSIS — I361 Nonrheumatic tricuspid (valve) insufficiency: Secondary | ICD-10-CM | POA: Diagnosis not present

## 2021-01-30 DIAGNOSIS — I340 Nonrheumatic mitral (valve) insufficiency: Secondary | ICD-10-CM | POA: Diagnosis not present

## 2021-01-30 DIAGNOSIS — R531 Weakness: Secondary | ICD-10-CM | POA: Diagnosis not present

## 2021-01-30 DIAGNOSIS — Z9181 History of falling: Secondary | ICD-10-CM | POA: Diagnosis not present

## 2021-01-30 DIAGNOSIS — Z20822 Contact with and (suspected) exposure to covid-19: Secondary | ICD-10-CM | POA: Diagnosis not present

## 2021-01-30 DIAGNOSIS — Z743 Need for continuous supervision: Secondary | ICD-10-CM | POA: Diagnosis not present

## 2021-01-30 DIAGNOSIS — E871 Hypo-osmolality and hyponatremia: Secondary | ICD-10-CM | POA: Diagnosis not present

## 2021-01-30 DIAGNOSIS — R41841 Cognitive communication deficit: Secondary | ICD-10-CM | POA: Diagnosis not present

## 2021-01-30 DIAGNOSIS — G9340 Encephalopathy, unspecified: Secondary | ICD-10-CM | POA: Diagnosis not present

## 2021-01-30 DIAGNOSIS — N049 Nephrotic syndrome with unspecified morphologic changes: Secondary | ICD-10-CM | POA: Diagnosis not present

## 2021-01-30 DIAGNOSIS — S8000XA Contusion of unspecified knee, initial encounter: Secondary | ICD-10-CM | POA: Diagnosis not present

## 2021-01-30 DIAGNOSIS — M6259 Muscle wasting and atrophy, not elsewhere classified, multiple sites: Secondary | ICD-10-CM | POA: Diagnosis not present

## 2021-01-30 DIAGNOSIS — G319 Degenerative disease of nervous system, unspecified: Secondary | ICD-10-CM | POA: Diagnosis not present

## 2021-01-30 DIAGNOSIS — I160 Hypertensive urgency: Secondary | ICD-10-CM | POA: Diagnosis not present

## 2021-01-30 DIAGNOSIS — F339 Major depressive disorder, recurrent, unspecified: Secondary | ICD-10-CM | POA: Diagnosis not present

## 2021-01-30 DIAGNOSIS — G903 Multi-system degeneration of the autonomic nervous system: Secondary | ICD-10-CM | POA: Diagnosis not present

## 2021-01-30 DIAGNOSIS — I351 Nonrheumatic aortic (valve) insufficiency: Secondary | ICD-10-CM | POA: Diagnosis not present

## 2021-02-10 DIAGNOSIS — R251 Tremor, unspecified: Secondary | ICD-10-CM | POA: Insufficient documentation

## 2021-02-10 DIAGNOSIS — H9190 Unspecified hearing loss, unspecified ear: Secondary | ICD-10-CM | POA: Insufficient documentation

## 2021-02-10 DIAGNOSIS — I10 Essential (primary) hypertension: Secondary | ICD-10-CM | POA: Insufficient documentation

## 2021-02-13 ENCOUNTER — Ambulatory Visit: Payer: Medicare Other | Admitting: Cardiology

## 2021-02-20 DIAGNOSIS — N390 Urinary tract infection, site not specified: Secondary | ICD-10-CM | POA: Diagnosis not present

## 2021-02-20 DIAGNOSIS — N049 Nephrotic syndrome with unspecified morphologic changes: Secondary | ICD-10-CM | POA: Diagnosis not present

## 2021-03-09 ENCOUNTER — Other Ambulatory Visit: Payer: Self-pay

## 2021-03-09 ENCOUNTER — Ambulatory Visit (INDEPENDENT_AMBULATORY_CARE_PROVIDER_SITE_OTHER): Payer: Medicare Other | Admitting: Podiatry

## 2021-03-09 DIAGNOSIS — B351 Tinea unguium: Secondary | ICD-10-CM

## 2021-03-09 DIAGNOSIS — M79609 Pain in unspecified limb: Secondary | ICD-10-CM | POA: Diagnosis not present

## 2021-03-09 NOTE — Progress Notes (Signed)
  Subjective:  Patient ID: Tonya Villa, female    DOB: [birth year],  MRN: [id number]  Chief Complaint  Patient presents with   debride    RFC -BL nails trimming     87 y.o. female presents with the above complaint. History confirmed with patient. Patient minimally contributive to history  PCP: Marylen Ponto, MD; last seen: unsure  Objective:  Physical Exam: warm, good capillary refill, nail exam severe mycotic nails with POP of the nailbeds, no trophic changes or ulcerative lesions. DP pulses palpable, PT pulses palpable, and protective sensation intact   No results found for: HGBA1C  No images are attached to the encounter.  Assessment:   1. Pain due to onychomycosis of nail    Plan:  Patient was evaluated and treated and all questions answered.  Onychomycosis -Nails palliatively debrided secondary to pain  Procedure: Nail Debridement Type of Debridement: manual, sharp debridement. Instrumentation: Nail nipper, rotary burr. Number of Nails: 10  Return if symptoms worsen or fail to improve.

## 2021-03-27 DIAGNOSIS — Z79899 Other long term (current) drug therapy: Secondary | ICD-10-CM | POA: Diagnosis not present

## 2021-04-03 DIAGNOSIS — Z9181 History of falling: Secondary | ICD-10-CM | POA: Diagnosis not present

## 2021-04-03 DIAGNOSIS — E871 Hypo-osmolality and hyponatremia: Secondary | ICD-10-CM | POA: Diagnosis not present

## 2021-04-03 DIAGNOSIS — G9340 Encephalopathy, unspecified: Secondary | ICD-10-CM | POA: Diagnosis not present

## 2021-04-03 DIAGNOSIS — F339 Major depressive disorder, recurrent, unspecified: Secondary | ICD-10-CM | POA: Diagnosis not present

## 2021-04-03 DIAGNOSIS — I10 Essential (primary) hypertension: Secondary | ICD-10-CM | POA: Diagnosis not present

## 2021-04-03 DIAGNOSIS — I160 Hypertensive urgency: Secondary | ICD-10-CM | POA: Diagnosis not present

## 2021-04-03 DIAGNOSIS — R41841 Cognitive communication deficit: Secondary | ICD-10-CM | POA: Diagnosis not present

## 2021-04-03 DIAGNOSIS — M6259 Muscle wasting and atrophy, not elsewhere classified, multiple sites: Secondary | ICD-10-CM | POA: Diagnosis not present

## 2021-04-03 DIAGNOSIS — N049 Nephrotic syndrome with unspecified morphologic changes: Secondary | ICD-10-CM | POA: Diagnosis not present

## 2021-04-03 DIAGNOSIS — G903 Multi-system degeneration of the autonomic nervous system: Secondary | ICD-10-CM | POA: Diagnosis not present

## 2021-04-03 DIAGNOSIS — E559 Vitamin D deficiency, unspecified: Secondary | ICD-10-CM | POA: Diagnosis not present

## 2021-04-06 DIAGNOSIS — F339 Major depressive disorder, recurrent, unspecified: Secondary | ICD-10-CM | POA: Diagnosis not present

## 2021-04-06 DIAGNOSIS — Z9181 History of falling: Secondary | ICD-10-CM | POA: Diagnosis not present

## 2021-04-06 DIAGNOSIS — M6259 Muscle wasting and atrophy, not elsewhere classified, multiple sites: Secondary | ICD-10-CM | POA: Diagnosis not present

## 2021-04-06 DIAGNOSIS — N049 Nephrotic syndrome with unspecified morphologic changes: Secondary | ICD-10-CM | POA: Diagnosis not present

## 2021-04-06 DIAGNOSIS — R41841 Cognitive communication deficit: Secondary | ICD-10-CM | POA: Diagnosis not present

## 2021-04-06 DIAGNOSIS — I160 Hypertensive urgency: Secondary | ICD-10-CM | POA: Diagnosis not present

## 2021-04-07 DIAGNOSIS — N049 Nephrotic syndrome with unspecified morphologic changes: Secondary | ICD-10-CM | POA: Diagnosis not present

## 2021-04-07 DIAGNOSIS — F339 Major depressive disorder, recurrent, unspecified: Secondary | ICD-10-CM | POA: Diagnosis not present

## 2021-04-07 DIAGNOSIS — I160 Hypertensive urgency: Secondary | ICD-10-CM | POA: Diagnosis not present

## 2021-04-07 DIAGNOSIS — R41841 Cognitive communication deficit: Secondary | ICD-10-CM | POA: Diagnosis not present

## 2021-04-07 DIAGNOSIS — Z9181 History of falling: Secondary | ICD-10-CM | POA: Diagnosis not present

## 2021-04-07 DIAGNOSIS — M6259 Muscle wasting and atrophy, not elsewhere classified, multiple sites: Secondary | ICD-10-CM | POA: Diagnosis not present

## 2021-04-08 DIAGNOSIS — N049 Nephrotic syndrome with unspecified morphologic changes: Secondary | ICD-10-CM | POA: Diagnosis not present

## 2021-04-08 DIAGNOSIS — Z9181 History of falling: Secondary | ICD-10-CM | POA: Diagnosis not present

## 2021-04-08 DIAGNOSIS — F339 Major depressive disorder, recurrent, unspecified: Secondary | ICD-10-CM | POA: Diagnosis not present

## 2021-04-08 DIAGNOSIS — I160 Hypertensive urgency: Secondary | ICD-10-CM | POA: Diagnosis not present

## 2021-04-08 DIAGNOSIS — M6259 Muscle wasting and atrophy, not elsewhere classified, multiple sites: Secondary | ICD-10-CM | POA: Diagnosis not present

## 2021-04-08 DIAGNOSIS — R41841 Cognitive communication deficit: Secondary | ICD-10-CM | POA: Diagnosis not present

## 2021-04-09 DIAGNOSIS — R41841 Cognitive communication deficit: Secondary | ICD-10-CM | POA: Diagnosis not present

## 2021-04-09 DIAGNOSIS — N049 Nephrotic syndrome with unspecified morphologic changes: Secondary | ICD-10-CM | POA: Diagnosis not present

## 2021-04-09 DIAGNOSIS — Z9181 History of falling: Secondary | ICD-10-CM | POA: Diagnosis not present

## 2021-04-09 DIAGNOSIS — I160 Hypertensive urgency: Secondary | ICD-10-CM | POA: Diagnosis not present

## 2021-04-09 DIAGNOSIS — F339 Major depressive disorder, recurrent, unspecified: Secondary | ICD-10-CM | POA: Diagnosis not present

## 2021-04-09 DIAGNOSIS — M6259 Muscle wasting and atrophy, not elsewhere classified, multiple sites: Secondary | ICD-10-CM | POA: Diagnosis not present

## 2021-04-10 DIAGNOSIS — M6259 Muscle wasting and atrophy, not elsewhere classified, multiple sites: Secondary | ICD-10-CM | POA: Diagnosis not present

## 2021-04-10 DIAGNOSIS — N049 Nephrotic syndrome with unspecified morphologic changes: Secondary | ICD-10-CM | POA: Diagnosis not present

## 2021-04-10 DIAGNOSIS — I160 Hypertensive urgency: Secondary | ICD-10-CM | POA: Diagnosis not present

## 2021-04-10 DIAGNOSIS — F339 Major depressive disorder, recurrent, unspecified: Secondary | ICD-10-CM | POA: Diagnosis not present

## 2021-04-10 DIAGNOSIS — R41841 Cognitive communication deficit: Secondary | ICD-10-CM | POA: Diagnosis not present

## 2021-04-10 DIAGNOSIS — Z9181 History of falling: Secondary | ICD-10-CM | POA: Diagnosis not present

## 2021-04-11 DIAGNOSIS — N049 Nephrotic syndrome with unspecified morphologic changes: Secondary | ICD-10-CM | POA: Diagnosis not present

## 2021-04-11 DIAGNOSIS — M6259 Muscle wasting and atrophy, not elsewhere classified, multiple sites: Secondary | ICD-10-CM | POA: Diagnosis not present

## 2021-04-11 DIAGNOSIS — Z9181 History of falling: Secondary | ICD-10-CM | POA: Diagnosis not present

## 2021-04-11 DIAGNOSIS — R41841 Cognitive communication deficit: Secondary | ICD-10-CM | POA: Diagnosis not present

## 2021-04-11 DIAGNOSIS — I160 Hypertensive urgency: Secondary | ICD-10-CM | POA: Diagnosis not present

## 2021-04-11 DIAGNOSIS — F339 Major depressive disorder, recurrent, unspecified: Secondary | ICD-10-CM | POA: Diagnosis not present

## 2021-04-12 DIAGNOSIS — I160 Hypertensive urgency: Secondary | ICD-10-CM | POA: Diagnosis not present

## 2021-04-12 DIAGNOSIS — R41841 Cognitive communication deficit: Secondary | ICD-10-CM | POA: Diagnosis not present

## 2021-04-12 DIAGNOSIS — Z9181 History of falling: Secondary | ICD-10-CM | POA: Diagnosis not present

## 2021-04-12 DIAGNOSIS — M6259 Muscle wasting and atrophy, not elsewhere classified, multiple sites: Secondary | ICD-10-CM | POA: Diagnosis not present

## 2021-04-12 DIAGNOSIS — N049 Nephrotic syndrome with unspecified morphologic changes: Secondary | ICD-10-CM | POA: Diagnosis not present

## 2021-04-12 DIAGNOSIS — F339 Major depressive disorder, recurrent, unspecified: Secondary | ICD-10-CM | POA: Diagnosis not present

## 2021-04-13 DIAGNOSIS — R41841 Cognitive communication deficit: Secondary | ICD-10-CM | POA: Diagnosis not present

## 2021-04-13 DIAGNOSIS — F339 Major depressive disorder, recurrent, unspecified: Secondary | ICD-10-CM | POA: Diagnosis not present

## 2021-04-13 DIAGNOSIS — I160 Hypertensive urgency: Secondary | ICD-10-CM | POA: Diagnosis not present

## 2021-04-13 DIAGNOSIS — Z9181 History of falling: Secondary | ICD-10-CM | POA: Diagnosis not present

## 2021-04-13 DIAGNOSIS — N049 Nephrotic syndrome with unspecified morphologic changes: Secondary | ICD-10-CM | POA: Diagnosis not present

## 2021-04-13 DIAGNOSIS — M6259 Muscle wasting and atrophy, not elsewhere classified, multiple sites: Secondary | ICD-10-CM | POA: Diagnosis not present

## 2021-04-14 DIAGNOSIS — N049 Nephrotic syndrome with unspecified morphologic changes: Secondary | ICD-10-CM | POA: Diagnosis not present

## 2021-04-14 DIAGNOSIS — Z9181 History of falling: Secondary | ICD-10-CM | POA: Diagnosis not present

## 2021-04-14 DIAGNOSIS — M6259 Muscle wasting and atrophy, not elsewhere classified, multiple sites: Secondary | ICD-10-CM | POA: Diagnosis not present

## 2021-04-14 DIAGNOSIS — I160 Hypertensive urgency: Secondary | ICD-10-CM | POA: Diagnosis not present

## 2021-04-14 DIAGNOSIS — F339 Major depressive disorder, recurrent, unspecified: Secondary | ICD-10-CM | POA: Diagnosis not present

## 2021-04-14 DIAGNOSIS — R41841 Cognitive communication deficit: Secondary | ICD-10-CM | POA: Diagnosis not present

## 2021-04-16 DIAGNOSIS — R41841 Cognitive communication deficit: Secondary | ICD-10-CM | POA: Diagnosis not present

## 2021-04-16 DIAGNOSIS — Z9181 History of falling: Secondary | ICD-10-CM | POA: Diagnosis not present

## 2021-04-16 DIAGNOSIS — M6259 Muscle wasting and atrophy, not elsewhere classified, multiple sites: Secondary | ICD-10-CM | POA: Diagnosis not present

## 2021-04-16 DIAGNOSIS — F339 Major depressive disorder, recurrent, unspecified: Secondary | ICD-10-CM | POA: Diagnosis not present

## 2021-04-16 DIAGNOSIS — N049 Nephrotic syndrome with unspecified morphologic changes: Secondary | ICD-10-CM | POA: Diagnosis not present

## 2021-04-16 DIAGNOSIS — I160 Hypertensive urgency: Secondary | ICD-10-CM | POA: Diagnosis not present

## 2021-04-17 DIAGNOSIS — M6259 Muscle wasting and atrophy, not elsewhere classified, multiple sites: Secondary | ICD-10-CM | POA: Diagnosis not present

## 2021-04-17 DIAGNOSIS — I160 Hypertensive urgency: Secondary | ICD-10-CM | POA: Diagnosis not present

## 2021-04-17 DIAGNOSIS — R41841 Cognitive communication deficit: Secondary | ICD-10-CM | POA: Diagnosis not present

## 2021-04-17 DIAGNOSIS — F339 Major depressive disorder, recurrent, unspecified: Secondary | ICD-10-CM | POA: Diagnosis not present

## 2021-04-17 DIAGNOSIS — Z9181 History of falling: Secondary | ICD-10-CM | POA: Diagnosis not present

## 2021-04-17 DIAGNOSIS — N049 Nephrotic syndrome with unspecified morphologic changes: Secondary | ICD-10-CM | POA: Diagnosis not present

## 2021-04-18 DIAGNOSIS — R41841 Cognitive communication deficit: Secondary | ICD-10-CM | POA: Diagnosis not present

## 2021-04-18 DIAGNOSIS — M6259 Muscle wasting and atrophy, not elsewhere classified, multiple sites: Secondary | ICD-10-CM | POA: Diagnosis not present

## 2021-04-18 DIAGNOSIS — I160 Hypertensive urgency: Secondary | ICD-10-CM | POA: Diagnosis not present

## 2021-04-18 DIAGNOSIS — F339 Major depressive disorder, recurrent, unspecified: Secondary | ICD-10-CM | POA: Diagnosis not present

## 2021-04-18 DIAGNOSIS — Z9181 History of falling: Secondary | ICD-10-CM | POA: Diagnosis not present

## 2021-04-18 DIAGNOSIS — N049 Nephrotic syndrome with unspecified morphologic changes: Secondary | ICD-10-CM | POA: Diagnosis not present

## 2021-04-20 DIAGNOSIS — Z9181 History of falling: Secondary | ICD-10-CM | POA: Diagnosis not present

## 2021-04-20 DIAGNOSIS — N049 Nephrotic syndrome with unspecified morphologic changes: Secondary | ICD-10-CM | POA: Diagnosis not present

## 2021-04-20 DIAGNOSIS — I160 Hypertensive urgency: Secondary | ICD-10-CM | POA: Diagnosis not present

## 2021-04-20 DIAGNOSIS — M6259 Muscle wasting and atrophy, not elsewhere classified, multiple sites: Secondary | ICD-10-CM | POA: Diagnosis not present

## 2021-04-20 DIAGNOSIS — F339 Major depressive disorder, recurrent, unspecified: Secondary | ICD-10-CM | POA: Diagnosis not present

## 2021-04-20 DIAGNOSIS — R41841 Cognitive communication deficit: Secondary | ICD-10-CM | POA: Diagnosis not present

## 2021-04-21 DIAGNOSIS — F339 Major depressive disorder, recurrent, unspecified: Secondary | ICD-10-CM | POA: Diagnosis not present

## 2021-04-21 DIAGNOSIS — N049 Nephrotic syndrome with unspecified morphologic changes: Secondary | ICD-10-CM | POA: Diagnosis not present

## 2021-04-21 DIAGNOSIS — R41841 Cognitive communication deficit: Secondary | ICD-10-CM | POA: Diagnosis not present

## 2021-04-21 DIAGNOSIS — I160 Hypertensive urgency: Secondary | ICD-10-CM | POA: Diagnosis not present

## 2021-04-21 DIAGNOSIS — M6259 Muscle wasting and atrophy, not elsewhere classified, multiple sites: Secondary | ICD-10-CM | POA: Diagnosis not present

## 2021-04-21 DIAGNOSIS — Z9181 History of falling: Secondary | ICD-10-CM | POA: Diagnosis not present

## 2021-04-22 DIAGNOSIS — I160 Hypertensive urgency: Secondary | ICD-10-CM | POA: Diagnosis not present

## 2021-04-22 DIAGNOSIS — F339 Major depressive disorder, recurrent, unspecified: Secondary | ICD-10-CM | POA: Diagnosis not present

## 2021-04-22 DIAGNOSIS — M6259 Muscle wasting and atrophy, not elsewhere classified, multiple sites: Secondary | ICD-10-CM | POA: Diagnosis not present

## 2021-04-22 DIAGNOSIS — R41841 Cognitive communication deficit: Secondary | ICD-10-CM | POA: Diagnosis not present

## 2021-04-22 DIAGNOSIS — N049 Nephrotic syndrome with unspecified morphologic changes: Secondary | ICD-10-CM | POA: Diagnosis not present

## 2021-04-22 DIAGNOSIS — Z9181 History of falling: Secondary | ICD-10-CM | POA: Diagnosis not present

## 2021-04-23 DIAGNOSIS — R41841 Cognitive communication deficit: Secondary | ICD-10-CM | POA: Diagnosis not present

## 2021-04-23 DIAGNOSIS — Z9181 History of falling: Secondary | ICD-10-CM | POA: Diagnosis not present

## 2021-04-23 DIAGNOSIS — F339 Major depressive disorder, recurrent, unspecified: Secondary | ICD-10-CM | POA: Diagnosis not present

## 2021-04-23 DIAGNOSIS — I160 Hypertensive urgency: Secondary | ICD-10-CM | POA: Diagnosis not present

## 2021-04-23 DIAGNOSIS — M6259 Muscle wasting and atrophy, not elsewhere classified, multiple sites: Secondary | ICD-10-CM | POA: Diagnosis not present

## 2021-04-23 DIAGNOSIS — N049 Nephrotic syndrome with unspecified morphologic changes: Secondary | ICD-10-CM | POA: Diagnosis not present

## 2021-04-24 DIAGNOSIS — I160 Hypertensive urgency: Secondary | ICD-10-CM | POA: Diagnosis not present

## 2021-04-24 DIAGNOSIS — R41841 Cognitive communication deficit: Secondary | ICD-10-CM | POA: Diagnosis not present

## 2021-04-24 DIAGNOSIS — F339 Major depressive disorder, recurrent, unspecified: Secondary | ICD-10-CM | POA: Diagnosis not present

## 2021-04-24 DIAGNOSIS — Z9181 History of falling: Secondary | ICD-10-CM | POA: Diagnosis not present

## 2021-04-24 DIAGNOSIS — M6259 Muscle wasting and atrophy, not elsewhere classified, multiple sites: Secondary | ICD-10-CM | POA: Diagnosis not present

## 2021-04-24 DIAGNOSIS — N049 Nephrotic syndrome with unspecified morphologic changes: Secondary | ICD-10-CM | POA: Diagnosis not present

## 2021-04-25 DIAGNOSIS — Z9181 History of falling: Secondary | ICD-10-CM | POA: Diagnosis not present

## 2021-04-25 DIAGNOSIS — R41841 Cognitive communication deficit: Secondary | ICD-10-CM | POA: Diagnosis not present

## 2021-04-25 DIAGNOSIS — F339 Major depressive disorder, recurrent, unspecified: Secondary | ICD-10-CM | POA: Diagnosis not present

## 2021-04-25 DIAGNOSIS — I160 Hypertensive urgency: Secondary | ICD-10-CM | POA: Diagnosis not present

## 2021-04-25 DIAGNOSIS — N049 Nephrotic syndrome with unspecified morphologic changes: Secondary | ICD-10-CM | POA: Diagnosis not present

## 2021-04-25 DIAGNOSIS — M6259 Muscle wasting and atrophy, not elsewhere classified, multiple sites: Secondary | ICD-10-CM | POA: Diagnosis not present

## 2021-04-27 DIAGNOSIS — G903 Multi-system degeneration of the autonomic nervous system: Secondary | ICD-10-CM | POA: Diagnosis not present

## 2021-04-27 DIAGNOSIS — Z9181 History of falling: Secondary | ICD-10-CM | POA: Diagnosis not present

## 2021-04-27 DIAGNOSIS — G9340 Encephalopathy, unspecified: Secondary | ICD-10-CM | POA: Diagnosis not present

## 2021-04-27 DIAGNOSIS — R41841 Cognitive communication deficit: Secondary | ICD-10-CM | POA: Diagnosis not present

## 2021-04-27 DIAGNOSIS — I10 Essential (primary) hypertension: Secondary | ICD-10-CM | POA: Diagnosis not present

## 2021-04-27 DIAGNOSIS — I160 Hypertensive urgency: Secondary | ICD-10-CM | POA: Diagnosis not present

## 2021-04-27 DIAGNOSIS — E871 Hypo-osmolality and hyponatremia: Secondary | ICD-10-CM | POA: Diagnosis not present

## 2021-04-27 DIAGNOSIS — F339 Major depressive disorder, recurrent, unspecified: Secondary | ICD-10-CM | POA: Diagnosis not present

## 2021-04-27 DIAGNOSIS — N049 Nephrotic syndrome with unspecified morphologic changes: Secondary | ICD-10-CM | POA: Diagnosis not present

## 2021-04-27 DIAGNOSIS — E559 Vitamin D deficiency, unspecified: Secondary | ICD-10-CM | POA: Diagnosis not present

## 2021-04-27 DIAGNOSIS — M6259 Muscle wasting and atrophy, not elsewhere classified, multiple sites: Secondary | ICD-10-CM | POA: Diagnosis not present

## 2021-04-28 DIAGNOSIS — F339 Major depressive disorder, recurrent, unspecified: Secondary | ICD-10-CM | POA: Diagnosis not present

## 2021-04-28 DIAGNOSIS — N049 Nephrotic syndrome with unspecified morphologic changes: Secondary | ICD-10-CM | POA: Diagnosis not present

## 2021-04-28 DIAGNOSIS — M6259 Muscle wasting and atrophy, not elsewhere classified, multiple sites: Secondary | ICD-10-CM | POA: Diagnosis not present

## 2021-04-28 DIAGNOSIS — Z9181 History of falling: Secondary | ICD-10-CM | POA: Diagnosis not present

## 2021-04-28 DIAGNOSIS — I160 Hypertensive urgency: Secondary | ICD-10-CM | POA: Diagnosis not present

## 2021-04-28 DIAGNOSIS — R41841 Cognitive communication deficit: Secondary | ICD-10-CM | POA: Diagnosis not present

## 2021-04-29 DIAGNOSIS — F339 Major depressive disorder, recurrent, unspecified: Secondary | ICD-10-CM | POA: Diagnosis not present

## 2021-04-29 DIAGNOSIS — I160 Hypertensive urgency: Secondary | ICD-10-CM | POA: Diagnosis not present

## 2021-04-29 DIAGNOSIS — R41841 Cognitive communication deficit: Secondary | ICD-10-CM | POA: Diagnosis not present

## 2021-04-29 DIAGNOSIS — M6259 Muscle wasting and atrophy, not elsewhere classified, multiple sites: Secondary | ICD-10-CM | POA: Diagnosis not present

## 2021-04-29 DIAGNOSIS — N049 Nephrotic syndrome with unspecified morphologic changes: Secondary | ICD-10-CM | POA: Diagnosis not present

## 2021-04-29 DIAGNOSIS — Z9181 History of falling: Secondary | ICD-10-CM | POA: Diagnosis not present

## 2021-04-30 DIAGNOSIS — R41841 Cognitive communication deficit: Secondary | ICD-10-CM | POA: Diagnosis not present

## 2021-04-30 DIAGNOSIS — I160 Hypertensive urgency: Secondary | ICD-10-CM | POA: Diagnosis not present

## 2021-04-30 DIAGNOSIS — F339 Major depressive disorder, recurrent, unspecified: Secondary | ICD-10-CM | POA: Diagnosis not present

## 2021-04-30 DIAGNOSIS — N049 Nephrotic syndrome with unspecified morphologic changes: Secondary | ICD-10-CM | POA: Diagnosis not present

## 2021-04-30 DIAGNOSIS — M6259 Muscle wasting and atrophy, not elsewhere classified, multiple sites: Secondary | ICD-10-CM | POA: Diagnosis not present

## 2021-04-30 DIAGNOSIS — Z9181 History of falling: Secondary | ICD-10-CM | POA: Diagnosis not present

## 2021-05-01 DIAGNOSIS — Z9181 History of falling: Secondary | ICD-10-CM | POA: Diagnosis not present

## 2021-05-01 DIAGNOSIS — N049 Nephrotic syndrome with unspecified morphologic changes: Secondary | ICD-10-CM | POA: Diagnosis not present

## 2021-05-01 DIAGNOSIS — R41841 Cognitive communication deficit: Secondary | ICD-10-CM | POA: Diagnosis not present

## 2021-05-01 DIAGNOSIS — M6259 Muscle wasting and atrophy, not elsewhere classified, multiple sites: Secondary | ICD-10-CM | POA: Diagnosis not present

## 2021-05-01 DIAGNOSIS — I160 Hypertensive urgency: Secondary | ICD-10-CM | POA: Diagnosis not present

## 2021-05-01 DIAGNOSIS — F339 Major depressive disorder, recurrent, unspecified: Secondary | ICD-10-CM | POA: Diagnosis not present

## 2021-05-02 DIAGNOSIS — N049 Nephrotic syndrome with unspecified morphologic changes: Secondary | ICD-10-CM | POA: Diagnosis not present

## 2021-05-02 DIAGNOSIS — I160 Hypertensive urgency: Secondary | ICD-10-CM | POA: Diagnosis not present

## 2021-05-02 DIAGNOSIS — M6259 Muscle wasting and atrophy, not elsewhere classified, multiple sites: Secondary | ICD-10-CM | POA: Diagnosis not present

## 2021-05-02 DIAGNOSIS — F339 Major depressive disorder, recurrent, unspecified: Secondary | ICD-10-CM | POA: Diagnosis not present

## 2021-05-02 DIAGNOSIS — R41841 Cognitive communication deficit: Secondary | ICD-10-CM | POA: Diagnosis not present

## 2021-05-02 DIAGNOSIS — Z9181 History of falling: Secondary | ICD-10-CM | POA: Diagnosis not present

## 2021-05-03 DIAGNOSIS — I160 Hypertensive urgency: Secondary | ICD-10-CM | POA: Diagnosis not present

## 2021-05-03 DIAGNOSIS — Z9181 History of falling: Secondary | ICD-10-CM | POA: Diagnosis not present

## 2021-05-03 DIAGNOSIS — R41841 Cognitive communication deficit: Secondary | ICD-10-CM | POA: Diagnosis not present

## 2021-05-03 DIAGNOSIS — F339 Major depressive disorder, recurrent, unspecified: Secondary | ICD-10-CM | POA: Diagnosis not present

## 2021-05-03 DIAGNOSIS — M6259 Muscle wasting and atrophy, not elsewhere classified, multiple sites: Secondary | ICD-10-CM | POA: Diagnosis not present

## 2021-05-03 DIAGNOSIS — N049 Nephrotic syndrome with unspecified morphologic changes: Secondary | ICD-10-CM | POA: Diagnosis not present

## 2021-05-04 DIAGNOSIS — N049 Nephrotic syndrome with unspecified morphologic changes: Secondary | ICD-10-CM | POA: Diagnosis not present

## 2021-05-04 DIAGNOSIS — I160 Hypertensive urgency: Secondary | ICD-10-CM | POA: Diagnosis not present

## 2021-05-04 DIAGNOSIS — F339 Major depressive disorder, recurrent, unspecified: Secondary | ICD-10-CM | POA: Diagnosis not present

## 2021-05-04 DIAGNOSIS — M6259 Muscle wasting and atrophy, not elsewhere classified, multiple sites: Secondary | ICD-10-CM | POA: Diagnosis not present

## 2021-05-04 DIAGNOSIS — R41841 Cognitive communication deficit: Secondary | ICD-10-CM | POA: Diagnosis not present

## 2021-05-04 DIAGNOSIS — Z9181 History of falling: Secondary | ICD-10-CM | POA: Diagnosis not present

## 2021-05-05 DIAGNOSIS — Z9181 History of falling: Secondary | ICD-10-CM | POA: Diagnosis not present

## 2021-05-05 DIAGNOSIS — R41841 Cognitive communication deficit: Secondary | ICD-10-CM | POA: Diagnosis not present

## 2021-05-05 DIAGNOSIS — N049 Nephrotic syndrome with unspecified morphologic changes: Secondary | ICD-10-CM | POA: Diagnosis not present

## 2021-05-05 DIAGNOSIS — F339 Major depressive disorder, recurrent, unspecified: Secondary | ICD-10-CM | POA: Diagnosis not present

## 2021-05-05 DIAGNOSIS — M6259 Muscle wasting and atrophy, not elsewhere classified, multiple sites: Secondary | ICD-10-CM | POA: Diagnosis not present

## 2021-05-05 DIAGNOSIS — I160 Hypertensive urgency: Secondary | ICD-10-CM | POA: Diagnosis not present

## 2021-05-06 DIAGNOSIS — N049 Nephrotic syndrome with unspecified morphologic changes: Secondary | ICD-10-CM | POA: Diagnosis not present

## 2021-05-06 DIAGNOSIS — I160 Hypertensive urgency: Secondary | ICD-10-CM | POA: Diagnosis not present

## 2021-05-06 DIAGNOSIS — F339 Major depressive disorder, recurrent, unspecified: Secondary | ICD-10-CM | POA: Diagnosis not present

## 2021-05-06 DIAGNOSIS — Z9181 History of falling: Secondary | ICD-10-CM | POA: Diagnosis not present

## 2021-05-06 DIAGNOSIS — R41841 Cognitive communication deficit: Secondary | ICD-10-CM | POA: Diagnosis not present

## 2021-05-06 DIAGNOSIS — M6259 Muscle wasting and atrophy, not elsewhere classified, multiple sites: Secondary | ICD-10-CM | POA: Diagnosis not present

## 2021-05-07 DIAGNOSIS — I160 Hypertensive urgency: Secondary | ICD-10-CM | POA: Diagnosis not present

## 2021-05-07 DIAGNOSIS — M6259 Muscle wasting and atrophy, not elsewhere classified, multiple sites: Secondary | ICD-10-CM | POA: Diagnosis not present

## 2021-05-07 DIAGNOSIS — Z9181 History of falling: Secondary | ICD-10-CM | POA: Diagnosis not present

## 2021-05-07 DIAGNOSIS — F339 Major depressive disorder, recurrent, unspecified: Secondary | ICD-10-CM | POA: Diagnosis not present

## 2021-05-07 DIAGNOSIS — R41841 Cognitive communication deficit: Secondary | ICD-10-CM | POA: Diagnosis not present

## 2021-05-07 DIAGNOSIS — N049 Nephrotic syndrome with unspecified morphologic changes: Secondary | ICD-10-CM | POA: Diagnosis not present

## 2021-05-08 DIAGNOSIS — M6259 Muscle wasting and atrophy, not elsewhere classified, multiple sites: Secondary | ICD-10-CM | POA: Diagnosis not present

## 2021-05-08 DIAGNOSIS — Z9181 History of falling: Secondary | ICD-10-CM | POA: Diagnosis not present

## 2021-05-08 DIAGNOSIS — F339 Major depressive disorder, recurrent, unspecified: Secondary | ICD-10-CM | POA: Diagnosis not present

## 2021-05-08 DIAGNOSIS — R41841 Cognitive communication deficit: Secondary | ICD-10-CM | POA: Diagnosis not present

## 2021-05-08 DIAGNOSIS — I160 Hypertensive urgency: Secondary | ICD-10-CM | POA: Diagnosis not present

## 2021-05-08 DIAGNOSIS — N049 Nephrotic syndrome with unspecified morphologic changes: Secondary | ICD-10-CM | POA: Diagnosis not present

## 2021-05-13 DIAGNOSIS — Z79899 Other long term (current) drug therapy: Secondary | ICD-10-CM | POA: Diagnosis not present

## 2021-05-13 DIAGNOSIS — M25569 Pain in unspecified knee: Secondary | ICD-10-CM | POA: Diagnosis not present

## 2021-05-17 DIAGNOSIS — M4854XA Collapsed vertebra, not elsewhere classified, thoracic region, initial encounter for fracture: Secondary | ICD-10-CM | POA: Diagnosis not present

## 2021-05-17 DIAGNOSIS — Z20822 Contact with and (suspected) exposure to covid-19: Secondary | ICD-10-CM | POA: Diagnosis not present

## 2021-05-17 DIAGNOSIS — I10 Essential (primary) hypertension: Secondary | ICD-10-CM | POA: Diagnosis not present

## 2021-05-17 DIAGNOSIS — F0390 Unspecified dementia without behavioral disturbance: Secondary | ICD-10-CM | POA: Diagnosis not present

## 2021-05-17 DIAGNOSIS — R079 Chest pain, unspecified: Secondary | ICD-10-CM | POA: Diagnosis not present

## 2021-05-17 DIAGNOSIS — G319 Degenerative disease of nervous system, unspecified: Secondary | ICD-10-CM | POA: Diagnosis not present

## 2021-05-17 DIAGNOSIS — M160 Bilateral primary osteoarthritis of hip: Secondary | ICD-10-CM | POA: Diagnosis not present

## 2021-05-17 DIAGNOSIS — M4319 Spondylolisthesis, multiple sites in spine: Secondary | ICD-10-CM | POA: Diagnosis not present

## 2021-05-17 DIAGNOSIS — S199XXA Unspecified injury of neck, initial encounter: Secondary | ICD-10-CM | POA: Diagnosis not present

## 2021-05-17 DIAGNOSIS — M47812 Spondylosis without myelopathy or radiculopathy, cervical region: Secondary | ICD-10-CM | POA: Diagnosis not present

## 2021-05-17 DIAGNOSIS — K5730 Diverticulosis of large intestine without perforation or abscess without bleeding: Secondary | ICD-10-CM | POA: Diagnosis not present

## 2021-05-17 DIAGNOSIS — M4312 Spondylolisthesis, cervical region: Secondary | ICD-10-CM | POA: Diagnosis not present

## 2021-05-17 DIAGNOSIS — R262 Difficulty in walking, not elsewhere classified: Secondary | ICD-10-CM | POA: Diagnosis not present

## 2021-05-17 DIAGNOSIS — S59901A Unspecified injury of right elbow, initial encounter: Secondary | ICD-10-CM | POA: Diagnosis not present

## 2021-05-17 DIAGNOSIS — S0083XA Contusion of other part of head, initial encounter: Secondary | ICD-10-CM | POA: Diagnosis not present

## 2021-05-17 DIAGNOSIS — Z043 Encounter for examination and observation following other accident: Secondary | ICD-10-CM | POA: Diagnosis not present

## 2021-05-17 DIAGNOSIS — S0003XA Contusion of scalp, initial encounter: Secondary | ICD-10-CM | POA: Diagnosis not present

## 2021-05-17 DIAGNOSIS — I878 Other specified disorders of veins: Secondary | ICD-10-CM | POA: Diagnosis not present

## 2021-05-17 DIAGNOSIS — Z79899 Other long term (current) drug therapy: Secondary | ICD-10-CM | POA: Diagnosis not present

## 2021-05-17 DIAGNOSIS — S4991XA Unspecified injury of right shoulder and upper arm, initial encounter: Secondary | ICD-10-CM | POA: Diagnosis not present

## 2021-05-17 DIAGNOSIS — Z743 Need for continuous supervision: Secondary | ICD-10-CM | POA: Diagnosis not present

## 2021-05-17 DIAGNOSIS — J90 Pleural effusion, not elsewhere classified: Secondary | ICD-10-CM | POA: Diagnosis not present

## 2021-05-17 DIAGNOSIS — M5030 Other cervical disc degeneration, unspecified cervical region: Secondary | ICD-10-CM | POA: Diagnosis not present

## 2021-05-17 DIAGNOSIS — N049 Nephrotic syndrome with unspecified morphologic changes: Secondary | ICD-10-CM | POA: Diagnosis not present

## 2021-05-17 DIAGNOSIS — M47816 Spondylosis without myelopathy or radiculopathy, lumbar region: Secondary | ICD-10-CM | POA: Diagnosis not present

## 2021-05-17 DIAGNOSIS — K8020 Calculus of gallbladder without cholecystitis without obstruction: Secondary | ICD-10-CM | POA: Diagnosis not present

## 2021-05-17 DIAGNOSIS — R600 Localized edema: Secondary | ICD-10-CM | POA: Diagnosis not present

## 2021-05-17 DIAGNOSIS — R531 Weakness: Secondary | ICD-10-CM | POA: Diagnosis not present

## 2021-05-17 DIAGNOSIS — M4856XA Collapsed vertebra, not elsewhere classified, lumbar region, initial encounter for fracture: Secondary | ICD-10-CM | POA: Diagnosis not present

## 2021-05-17 DIAGNOSIS — E8809 Other disorders of plasma-protein metabolism, not elsewhere classified: Secondary | ICD-10-CM | POA: Diagnosis not present

## 2021-05-17 DIAGNOSIS — E041 Nontoxic single thyroid nodule: Secondary | ICD-10-CM | POA: Diagnosis not present

## 2021-05-17 DIAGNOSIS — E871 Hypo-osmolality and hyponatremia: Secondary | ICD-10-CM | POA: Diagnosis not present

## 2021-05-17 DIAGNOSIS — I6529 Occlusion and stenosis of unspecified carotid artery: Secondary | ICD-10-CM | POA: Diagnosis not present

## 2021-05-18 DIAGNOSIS — E8809 Other disorders of plasma-protein metabolism, not elsewhere classified: Secondary | ICD-10-CM | POA: Diagnosis not present

## 2021-05-18 DIAGNOSIS — N049 Nephrotic syndrome with unspecified morphologic changes: Secondary | ICD-10-CM | POA: Diagnosis not present

## 2021-05-18 DIAGNOSIS — R262 Difficulty in walking, not elsewhere classified: Secondary | ICD-10-CM | POA: Diagnosis not present

## 2021-05-19 DIAGNOSIS — S0083XA Contusion of other part of head, initial encounter: Secondary | ICD-10-CM | POA: Diagnosis not present

## 2021-05-19 DIAGNOSIS — N049 Nephrotic syndrome with unspecified morphologic changes: Secondary | ICD-10-CM | POA: Diagnosis not present

## 2021-05-19 DIAGNOSIS — M47816 Spondylosis without myelopathy or radiculopathy, lumbar region: Secondary | ICD-10-CM | POA: Diagnosis not present

## 2021-05-19 DIAGNOSIS — W19XXXA Unspecified fall, initial encounter: Secondary | ICD-10-CM | POA: Diagnosis not present

## 2021-05-19 DIAGNOSIS — M47812 Spondylosis without myelopathy or radiculopathy, cervical region: Secondary | ICD-10-CM | POA: Diagnosis not present

## 2021-05-19 DIAGNOSIS — M542 Cervicalgia: Secondary | ICD-10-CM | POA: Diagnosis not present

## 2021-05-19 DIAGNOSIS — I959 Hypotension, unspecified: Secondary | ICD-10-CM | POA: Diagnosis not present

## 2021-05-19 DIAGNOSIS — Z743 Need for continuous supervision: Secondary | ICD-10-CM | POA: Diagnosis not present

## 2021-05-19 DIAGNOSIS — I10 Essential (primary) hypertension: Secondary | ICD-10-CM | POA: Diagnosis not present

## 2021-05-19 DIAGNOSIS — S0003XA Contusion of scalp, initial encounter: Secondary | ICD-10-CM | POA: Diagnosis not present

## 2021-05-19 DIAGNOSIS — G319 Degenerative disease of nervous system, unspecified: Secondary | ICD-10-CM | POA: Diagnosis not present

## 2021-05-19 DIAGNOSIS — E041 Nontoxic single thyroid nodule: Secondary | ICD-10-CM | POA: Diagnosis not present

## 2021-05-19 DIAGNOSIS — S0990XA Unspecified injury of head, initial encounter: Secondary | ICD-10-CM | POA: Diagnosis not present

## 2021-05-19 DIAGNOSIS — Z043 Encounter for examination and observation following other accident: Secondary | ICD-10-CM | POA: Diagnosis not present

## 2021-05-19 DIAGNOSIS — R410 Disorientation, unspecified: Secondary | ICD-10-CM | POA: Diagnosis not present

## 2021-05-19 DIAGNOSIS — R279 Unspecified lack of coordination: Secondary | ICD-10-CM | POA: Diagnosis not present

## 2021-05-19 DIAGNOSIS — F0390 Unspecified dementia without behavioral disturbance: Secondary | ICD-10-CM | POA: Diagnosis not present

## 2021-05-19 DIAGNOSIS — I491 Atrial premature depolarization: Secondary | ICD-10-CM | POA: Diagnosis not present

## 2021-05-19 DIAGNOSIS — R262 Difficulty in walking, not elsewhere classified: Secondary | ICD-10-CM | POA: Diagnosis not present

## 2021-05-19 DIAGNOSIS — M4319 Spondylolisthesis, multiple sites in spine: Secondary | ICD-10-CM | POA: Diagnosis not present

## 2021-05-19 DIAGNOSIS — E8809 Other disorders of plasma-protein metabolism, not elsewhere classified: Secondary | ICD-10-CM | POA: Diagnosis not present

## 2021-05-19 DIAGNOSIS — I6529 Occlusion and stenosis of unspecified carotid artery: Secondary | ICD-10-CM | POA: Diagnosis not present

## 2021-05-19 DIAGNOSIS — S199XXA Unspecified injury of neck, initial encounter: Secondary | ICD-10-CM | POA: Diagnosis not present

## 2021-05-19 DIAGNOSIS — M160 Bilateral primary osteoarthritis of hip: Secondary | ICD-10-CM | POA: Diagnosis not present

## 2021-05-20 DIAGNOSIS — M50322 Other cervical disc degeneration at C5-C6 level: Secondary | ICD-10-CM | POA: Diagnosis not present

## 2021-05-20 DIAGNOSIS — F339 Major depressive disorder, recurrent, unspecified: Secondary | ICD-10-CM | POA: Diagnosis not present

## 2021-05-20 DIAGNOSIS — E871 Hypo-osmolality and hyponatremia: Secondary | ICD-10-CM | POA: Diagnosis not present

## 2021-05-20 DIAGNOSIS — Z9181 History of falling: Secondary | ICD-10-CM | POA: Diagnosis not present

## 2021-05-20 DIAGNOSIS — I160 Hypertensive urgency: Secondary | ICD-10-CM | POA: Diagnosis not present

## 2021-05-20 DIAGNOSIS — R41841 Cognitive communication deficit: Secondary | ICD-10-CM | POA: Diagnosis not present

## 2021-05-20 DIAGNOSIS — G903 Multi-system degeneration of the autonomic nervous system: Secondary | ICD-10-CM | POA: Diagnosis not present

## 2021-05-20 DIAGNOSIS — S0083XA Contusion of other part of head, initial encounter: Secondary | ICD-10-CM | POA: Diagnosis not present

## 2021-05-20 DIAGNOSIS — Z043 Encounter for examination and observation following other accident: Secondary | ICD-10-CM | POA: Diagnosis not present

## 2021-05-20 DIAGNOSIS — J9811 Atelectasis: Secondary | ICD-10-CM | POA: Diagnosis not present

## 2021-05-20 DIAGNOSIS — S0003XA Contusion of scalp, initial encounter: Secondary | ICD-10-CM | POA: Diagnosis not present

## 2021-05-20 DIAGNOSIS — R531 Weakness: Secondary | ICD-10-CM | POA: Diagnosis not present

## 2021-05-20 DIAGNOSIS — I10 Essential (primary) hypertension: Secondary | ICD-10-CM | POA: Diagnosis not present

## 2021-05-20 DIAGNOSIS — M50321 Other cervical disc degeneration at C4-C5 level: Secondary | ICD-10-CM | POA: Diagnosis not present

## 2021-05-20 DIAGNOSIS — W19XXXA Unspecified fall, initial encounter: Secondary | ICD-10-CM | POA: Diagnosis not present

## 2021-05-20 DIAGNOSIS — N049 Nephrotic syndrome with unspecified morphologic changes: Secondary | ICD-10-CM | POA: Diagnosis not present

## 2021-05-20 DIAGNOSIS — G9340 Encephalopathy, unspecified: Secondary | ICD-10-CM | POA: Diagnosis not present

## 2021-05-20 DIAGNOSIS — M6259 Muscle wasting and atrophy, not elsewhere classified, multiple sites: Secondary | ICD-10-CM | POA: Diagnosis not present

## 2021-05-20 DIAGNOSIS — E559 Vitamin D deficiency, unspecified: Secondary | ICD-10-CM | POA: Diagnosis not present

## 2021-05-20 DIAGNOSIS — Z743 Need for continuous supervision: Secondary | ICD-10-CM | POA: Diagnosis not present

## 2021-05-20 DIAGNOSIS — R279 Unspecified lack of coordination: Secondary | ICD-10-CM | POA: Diagnosis not present

## 2021-05-21 DIAGNOSIS — M6259 Muscle wasting and atrophy, not elsewhere classified, multiple sites: Secondary | ICD-10-CM | POA: Diagnosis not present

## 2021-05-21 DIAGNOSIS — R41841 Cognitive communication deficit: Secondary | ICD-10-CM | POA: Diagnosis not present

## 2021-05-21 DIAGNOSIS — Z9181 History of falling: Secondary | ICD-10-CM | POA: Diagnosis not present

## 2021-05-21 DIAGNOSIS — N049 Nephrotic syndrome with unspecified morphologic changes: Secondary | ICD-10-CM | POA: Diagnosis not present

## 2021-05-21 DIAGNOSIS — F339 Major depressive disorder, recurrent, unspecified: Secondary | ICD-10-CM | POA: Diagnosis not present

## 2021-05-21 DIAGNOSIS — I160 Hypertensive urgency: Secondary | ICD-10-CM | POA: Diagnosis not present

## 2021-05-22 DIAGNOSIS — N049 Nephrotic syndrome with unspecified morphologic changes: Secondary | ICD-10-CM | POA: Diagnosis not present

## 2021-05-22 DIAGNOSIS — R41841 Cognitive communication deficit: Secondary | ICD-10-CM | POA: Diagnosis not present

## 2021-05-22 DIAGNOSIS — Z9181 History of falling: Secondary | ICD-10-CM | POA: Diagnosis not present

## 2021-05-22 DIAGNOSIS — R5381 Other malaise: Secondary | ICD-10-CM | POA: Diagnosis not present

## 2021-05-22 DIAGNOSIS — F0390 Unspecified dementia without behavioral disturbance: Secondary | ICD-10-CM | POA: Diagnosis not present

## 2021-05-22 DIAGNOSIS — M6259 Muscle wasting and atrophy, not elsewhere classified, multiple sites: Secondary | ICD-10-CM | POA: Diagnosis not present

## 2021-05-22 DIAGNOSIS — I10 Essential (primary) hypertension: Secondary | ICD-10-CM | POA: Diagnosis not present

## 2021-05-22 DIAGNOSIS — F339 Major depressive disorder, recurrent, unspecified: Secondary | ICD-10-CM | POA: Diagnosis not present

## 2021-05-22 DIAGNOSIS — R251 Tremor, unspecified: Secondary | ICD-10-CM | POA: Diagnosis not present

## 2021-05-22 DIAGNOSIS — I160 Hypertensive urgency: Secondary | ICD-10-CM | POA: Diagnosis not present

## 2021-05-23 DIAGNOSIS — M6259 Muscle wasting and atrophy, not elsewhere classified, multiple sites: Secondary | ICD-10-CM | POA: Diagnosis not present

## 2021-05-23 DIAGNOSIS — F339 Major depressive disorder, recurrent, unspecified: Secondary | ICD-10-CM | POA: Diagnosis not present

## 2021-05-23 DIAGNOSIS — I160 Hypertensive urgency: Secondary | ICD-10-CM | POA: Diagnosis not present

## 2021-05-23 DIAGNOSIS — R41841 Cognitive communication deficit: Secondary | ICD-10-CM | POA: Diagnosis not present

## 2021-05-23 DIAGNOSIS — Z9181 History of falling: Secondary | ICD-10-CM | POA: Diagnosis not present

## 2021-05-23 DIAGNOSIS — N049 Nephrotic syndrome with unspecified morphologic changes: Secondary | ICD-10-CM | POA: Diagnosis not present

## 2021-05-25 DIAGNOSIS — R41841 Cognitive communication deficit: Secondary | ICD-10-CM | POA: Diagnosis not present

## 2021-05-25 DIAGNOSIS — F339 Major depressive disorder, recurrent, unspecified: Secondary | ICD-10-CM | POA: Diagnosis not present

## 2021-05-25 DIAGNOSIS — I160 Hypertensive urgency: Secondary | ICD-10-CM | POA: Diagnosis not present

## 2021-05-25 DIAGNOSIS — M6259 Muscle wasting and atrophy, not elsewhere classified, multiple sites: Secondary | ICD-10-CM | POA: Diagnosis not present

## 2021-05-25 DIAGNOSIS — Z9181 History of falling: Secondary | ICD-10-CM | POA: Diagnosis not present

## 2021-05-25 DIAGNOSIS — N049 Nephrotic syndrome with unspecified morphologic changes: Secondary | ICD-10-CM | POA: Diagnosis not present

## 2021-05-26 ENCOUNTER — Other Ambulatory Visit: Payer: Self-pay

## 2021-05-26 DIAGNOSIS — I10 Essential (primary) hypertension: Secondary | ICD-10-CM | POA: Insufficient documentation

## 2021-05-26 DIAGNOSIS — D692 Other nonthrombocytopenic purpura: Secondary | ICD-10-CM | POA: Diagnosis not present

## 2021-05-26 DIAGNOSIS — F339 Major depressive disorder, recurrent, unspecified: Secondary | ICD-10-CM | POA: Diagnosis not present

## 2021-05-26 DIAGNOSIS — E871 Hypo-osmolality and hyponatremia: Secondary | ICD-10-CM | POA: Insufficient documentation

## 2021-05-26 DIAGNOSIS — I160 Hypertensive urgency: Secondary | ICD-10-CM | POA: Diagnosis not present

## 2021-05-26 DIAGNOSIS — G9340 Encephalopathy, unspecified: Secondary | ICD-10-CM | POA: Insufficient documentation

## 2021-05-26 DIAGNOSIS — R609 Edema, unspecified: Secondary | ICD-10-CM | POA: Diagnosis not present

## 2021-05-26 DIAGNOSIS — N049 Nephrotic syndrome with unspecified morphologic changes: Secondary | ICD-10-CM | POA: Diagnosis not present

## 2021-05-26 DIAGNOSIS — G903 Multi-system degeneration of the autonomic nervous system: Secondary | ICD-10-CM | POA: Insufficient documentation

## 2021-05-26 DIAGNOSIS — M6259 Muscle wasting and atrophy, not elsewhere classified, multiple sites: Secondary | ICD-10-CM | POA: Diagnosis not present

## 2021-05-26 DIAGNOSIS — R918 Other nonspecific abnormal finding of lung field: Secondary | ICD-10-CM | POA: Diagnosis not present

## 2021-05-26 DIAGNOSIS — R41841 Cognitive communication deficit: Secondary | ICD-10-CM | POA: Diagnosis not present

## 2021-05-26 DIAGNOSIS — F0390 Unspecified dementia without behavioral disturbance: Secondary | ICD-10-CM | POA: Diagnosis not present

## 2021-05-26 DIAGNOSIS — Z9181 History of falling: Secondary | ICD-10-CM | POA: Insufficient documentation

## 2021-05-27 ENCOUNTER — Encounter: Payer: Self-pay | Admitting: Cardiology

## 2021-05-27 ENCOUNTER — Ambulatory Visit (INDEPENDENT_AMBULATORY_CARE_PROVIDER_SITE_OTHER): Payer: Medicare Other | Admitting: Cardiology

## 2021-05-27 ENCOUNTER — Other Ambulatory Visit: Payer: Self-pay

## 2021-05-27 VITALS — BP 154/64 | HR 67 | Ht 62.0 in | Wt 168.4 lb

## 2021-05-27 DIAGNOSIS — I5032 Chronic diastolic (congestive) heart failure: Secondary | ICD-10-CM | POA: Diagnosis not present

## 2021-05-27 DIAGNOSIS — I5030 Unspecified diastolic (congestive) heart failure: Secondary | ICD-10-CM | POA: Insufficient documentation

## 2021-05-27 DIAGNOSIS — R600 Localized edema: Secondary | ICD-10-CM

## 2021-05-27 DIAGNOSIS — I351 Nonrheumatic aortic (valve) insufficiency: Secondary | ICD-10-CM

## 2021-05-27 DIAGNOSIS — G903 Multi-system degeneration of the autonomic nervous system: Secondary | ICD-10-CM | POA: Diagnosis not present

## 2021-05-27 DIAGNOSIS — I10 Essential (primary) hypertension: Secondary | ICD-10-CM

## 2021-05-27 DIAGNOSIS — B351 Tinea unguium: Secondary | ICD-10-CM | POA: Diagnosis not present

## 2021-05-27 DIAGNOSIS — F339 Major depressive disorder, recurrent, unspecified: Secondary | ICD-10-CM | POA: Diagnosis not present

## 2021-05-27 DIAGNOSIS — M6259 Muscle wasting and atrophy, not elsewhere classified, multiple sites: Secondary | ICD-10-CM | POA: Diagnosis not present

## 2021-05-27 DIAGNOSIS — R41841 Cognitive communication deficit: Secondary | ICD-10-CM | POA: Diagnosis not present

## 2021-05-27 DIAGNOSIS — M79675 Pain in left toe(s): Secondary | ICD-10-CM | POA: Diagnosis not present

## 2021-05-27 DIAGNOSIS — E559 Vitamin D deficiency, unspecified: Secondary | ICD-10-CM | POA: Diagnosis not present

## 2021-05-27 DIAGNOSIS — G9340 Encephalopathy, unspecified: Secondary | ICD-10-CM | POA: Diagnosis not present

## 2021-05-27 DIAGNOSIS — M7989 Other specified soft tissue disorders: Secondary | ICD-10-CM | POA: Diagnosis not present

## 2021-05-27 DIAGNOSIS — I160 Hypertensive urgency: Secondary | ICD-10-CM | POA: Diagnosis not present

## 2021-05-27 DIAGNOSIS — M79674 Pain in right toe(s): Secondary | ICD-10-CM | POA: Diagnosis not present

## 2021-05-27 DIAGNOSIS — M6281 Muscle weakness (generalized): Secondary | ICD-10-CM | POA: Diagnosis not present

## 2021-05-27 DIAGNOSIS — E871 Hypo-osmolality and hyponatremia: Secondary | ICD-10-CM | POA: Diagnosis not present

## 2021-05-27 HISTORY — DX: Nonrheumatic aortic (valve) insufficiency: I35.1

## 2021-05-27 HISTORY — DX: Localized edema: R60.0

## 2021-05-27 HISTORY — DX: Unspecified diastolic (congestive) heart failure: I50.30

## 2021-05-27 NOTE — Patient Instructions (Signed)
Medication Instructions:  Your physician recommends that you continue on your current medications as directed. Please refer to the Current Medication list given to you today.  *If you need a refill on your cardiac medications before your next appointment, please call your pharmacy*   Lab Work: NONE If you have labs (blood work) drawn today and your tests are completely normal, you will receive your results only by: MyChart Message (if you have MyChart) OR A paper copy in the mail If you have any lab test that is abnormal or we need to change your treatment, we will call you to review the results.   Testing/Procedures: Your physician has requested that you have a lower or upper extremity venous duplex. This test is an ultrasound of the veins in the legs or arms. It looks at venous blood flow that carries blood from the heart to the legs or arms. Allow one hour for a Lower Venous exam. Allow thirty minutes for an Upper Venous exam. There are no restrictions or special instructions.     Follow-Up: At [facility], you and your health needs are our priority.  As part of our continuing mission to provide you with exceptional heart care, we have created designated Provider Care Teams.  These Care Teams include your primary Cardiologist (physician) and Advanced Practice Providers (APPs -  Physician Assistants and Nurse Practitioners) who all work together to provide you with the care you need, when you need it.  We recommend signing up for the patient portal called "MyChart".  Sign up information is provided on this After Visit Summary.  MyChart is used to connect with patients for Virtual Visits (Telemedicine).  Patients are able to view lab/test results, encounter notes, upcoming appointments, etc.  Non-urgent messages can be sent to your provider as well.   To learn more about what you can do with MyChart, go to [email or web address].    Your next appointment:   6 week(s)  The format for  your next appointment:   In Person  Provider:   Belva Cromeajan Revankar, MD    Other Instructions

## 2021-05-27 NOTE — Progress Notes (Signed)
Cardiology Office Note:    Date:  05/27/2021   ID:  Tonya Villa, DOB 1927-04-07, MRN [id number]  PCP:  Ronita Hipps, MD  Cardiologist:  Jenean Lindau, MD   Referring MD: Ronita Hipps, MD    ASSESSMENT:    1. Leg swelling   2. Essential (primary) hypertension   3. Pedal edema   4. Chronic heart failure with preserved ejection fraction (HCC)   5. Moderate aortic regurgitation    PLAN:    In order of problems listed above:  Primary prevention stressed with the patient.  Importance of compliance with diet medication stressed and she vocalized understanding. Mild to moderate aortic regurgitation: Medical management at this time.  Echocardiogram was reviewed. Congestive heart failure: Preserved ejection fraction.  I would like to know whether she is getting diuresis on a regular basis at the nursing home.  If so this has just been initiated today and we will await her response.  She is followed by her primary care provider's at the nursing home.  Again if blood work is done I would like to get a copy of it to.  Written instructions of this was given to the patient's family.  On note of this evaluation was also given. Pedal edema: Bilateral right greater than left.  This has been ongoing for the past several weeks and I would like to do a DVT study in the next day or 2.  They are agreeable. Further recommendations will be made based on the findings of the blood work report from the nursing home, chest x-ray for 1 was done and the findings of the aforementioned test.  Patient's daughters had multiple questions which were answered to satisfaction.   Medication Adjustments/Labs and Tests Ordered: Current medicines are reviewed at length with the patient today.  Concerns regarding medicines are outlined above.  Orders Placed This Encounter  Procedures   EKG 12-Lead   VAS [place] LOWER EXTREMITY VENOUS (DVT)   No orders of the defined types were placed in this  encounter.    History of Present Illness:    Tonya Villa is a 88 y.o. female who is being seen today for the evaluation of congestive heart failure at the request of Ronita Hipps, MD. patient is a pleasant 88 year old female.  She is a resident of a nursing home.  Her daughter is accompanying her for this visit.  She was admitted to [facility] in October and also couple of weeks ago.  She was admitted with congestive heart failure.  Systolic function is preserved she has mild to moderate aortic regurgitation.  She denies any problems at this time.  She is brought in wheelchair.  The daughter is mentioned to me that she has orthopnea.  At the time of my evaluation, the patient is alert awake oriented and in no distress.  She has been started on IV access this morning and intravenous Lasix has been given.  We do not know what the chest x-ray or blood work has been drawn and the family are going to check and let me know.  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   Cholecalciferol 25 MCG (1000 UT) capsule Take 1,000 Units by mouth daily.   escitalopram (LEXAPRO) 10 MG tablet Take 10 mg by mouth daily.   lisinopril (ZESTRIL)  10 MG tablet Take 10 mg by mouth daily.   loratadine (CLARITIN) 10 MG tablet Take 10 mg by mouth daily as needed for allergies.   mycophenolate (CELLCEPT) 250 MG capsule Take 250 mg by mouth daily.   propranolol (INDERAL) 60 MG tablet Take 60 mg by mouth daily.     Allergies:   Codeine   Social History   Socioeconomic History   Marital status: Widowed    Spouse name: Not on file   Number of children: 3   Years of education: Not on file   Highest education level: 12th grade  Occupational History   Not on file  Tobacco Use   Smoking status: Never   Smokeless tobacco: Never  Vaping Use   Vaping Use: Never used  Substance and Sexual Activity   Alcohol use: Never   Drug use: Never   Sexual activity: Not on file  Other Topics Concern   Not on file  Social History Narrative   No caffeine   Right handed    Lives home at home alone    Social Determinants of Health   Financial Resource Strain: Not on file  Food Insecurity: Not on file  Transportation Needs: Not on file  Physical Activity: Not on file  Stress: Not on file  Social Connections: Not on file     Family History: The patient's family history includes Alzheimer's disease in her mother and sister; Tremor in her brother, paternal grandmother, and sister.  ROS:   Please see the history of present illness.    All other systems reviewed and are negative.  EKGs/Labs/Other Studies Reviewed:    The following studies were reviewed today: EKG reveals sinus rhythm and nonspecific ST-T changes   Recent Labs: No results found for requested labs within last 8760 hours.  Recent Lipid Panel No results found for: CHOL, TRIG, HDL, CHOLHDL, VLDL, LDLCALC, LDLDIRECT  Physical Exam:    VS:  BP (!) 154/64    Pulse 67    Ht 5\' 2"  (1.575 m)    Wt 168 lb 6.4 oz (76.4 kg)    SpO2 94%    BMI 30.80 kg/m     Wt Readings from Last 3 Encounters:  05/27/21 168 lb 6.4 oz (76.4 kg)  11/03/20 157 lb 8 oz (71.4 kg)  11/14/19 149 lb  (67.6 kg)     GEN: Patient is in no acute distress HEENT: Normal NECK: No JVD; No carotid bruits LYMPHATICS: No lymphadenopathy CARDIAC: S1 S2 regular, 2/6 systolic murmur at the apex. RESPIRATORY:  Clear to auscultation without rales, wheezing or rhonchi  ABDOMEN: Soft, non-tender, non-distended MUSCULOSKELETAL: Bilateral pedal edema right greater than left edema; No deformity  SKIN: Warm and dry NEUROLOGIC:  Alert and oriented x 3 PSYCHIATRIC:  Normal affect    Signed, Aundra Dubin Marton Malizia,  MD  05/27/2021 4:11 PM    [facility]

## 2021-05-28 DIAGNOSIS — I160 Hypertensive urgency: Secondary | ICD-10-CM | POA: Diagnosis not present

## 2021-05-28 DIAGNOSIS — M6259 Muscle wasting and atrophy, not elsewhere classified, multiple sites: Secondary | ICD-10-CM | POA: Diagnosis not present

## 2021-05-28 DIAGNOSIS — E559 Vitamin D deficiency, unspecified: Secondary | ICD-10-CM | POA: Diagnosis not present

## 2021-05-28 DIAGNOSIS — M6281 Muscle weakness (generalized): Secondary | ICD-10-CM | POA: Diagnosis not present

## 2021-05-28 DIAGNOSIS — R41841 Cognitive communication deficit: Secondary | ICD-10-CM | POA: Diagnosis not present

## 2021-05-28 DIAGNOSIS — F339 Major depressive disorder, recurrent, unspecified: Secondary | ICD-10-CM | POA: Diagnosis not present

## 2021-05-29 DIAGNOSIS — I10 Essential (primary) hypertension: Secondary | ICD-10-CM | POA: Diagnosis not present

## 2021-05-29 DIAGNOSIS — F339 Major depressive disorder, recurrent, unspecified: Secondary | ICD-10-CM | POA: Diagnosis not present

## 2021-05-29 DIAGNOSIS — R6889 Other general symptoms and signs: Secondary | ICD-10-CM | POA: Diagnosis not present

## 2021-05-29 DIAGNOSIS — I160 Hypertensive urgency: Secondary | ICD-10-CM | POA: Diagnosis not present

## 2021-05-29 DIAGNOSIS — M6281 Muscle weakness (generalized): Secondary | ICD-10-CM | POA: Diagnosis not present

## 2021-05-29 DIAGNOSIS — G9340 Encephalopathy, unspecified: Secondary | ICD-10-CM | POA: Diagnosis not present

## 2021-05-29 DIAGNOSIS — R41841 Cognitive communication deficit: Secondary | ICD-10-CM | POA: Diagnosis not present

## 2021-05-29 DIAGNOSIS — M6259 Muscle wasting and atrophy, not elsewhere classified, multiple sites: Secondary | ICD-10-CM | POA: Diagnosis not present

## 2021-05-29 DIAGNOSIS — E559 Vitamin D deficiency, unspecified: Secondary | ICD-10-CM | POA: Diagnosis not present

## 2021-05-29 DIAGNOSIS — E871 Hypo-osmolality and hyponatremia: Secondary | ICD-10-CM | POA: Diagnosis not present

## 2021-05-29 DIAGNOSIS — G903 Multi-system degeneration of the autonomic nervous system: Secondary | ICD-10-CM | POA: Diagnosis not present

## 2021-05-29 DIAGNOSIS — R7989 Other specified abnormal findings of blood chemistry: Secondary | ICD-10-CM | POA: Diagnosis not present

## 2021-05-30 DIAGNOSIS — M6281 Muscle weakness (generalized): Secondary | ICD-10-CM | POA: Diagnosis not present

## 2021-05-30 DIAGNOSIS — E559 Vitamin D deficiency, unspecified: Secondary | ICD-10-CM | POA: Diagnosis not present

## 2021-05-30 DIAGNOSIS — I160 Hypertensive urgency: Secondary | ICD-10-CM | POA: Diagnosis not present

## 2021-05-30 DIAGNOSIS — F339 Major depressive disorder, recurrent, unspecified: Secondary | ICD-10-CM | POA: Diagnosis not present

## 2021-05-30 DIAGNOSIS — R41841 Cognitive communication deficit: Secondary | ICD-10-CM | POA: Diagnosis not present

## 2021-05-30 DIAGNOSIS — M6259 Muscle wasting and atrophy, not elsewhere classified, multiple sites: Secondary | ICD-10-CM | POA: Diagnosis not present

## 2021-05-31 DIAGNOSIS — M6281 Muscle weakness (generalized): Secondary | ICD-10-CM | POA: Diagnosis not present

## 2021-05-31 DIAGNOSIS — R41841 Cognitive communication deficit: Secondary | ICD-10-CM | POA: Diagnosis not present

## 2021-05-31 DIAGNOSIS — I160 Hypertensive urgency: Secondary | ICD-10-CM | POA: Diagnosis not present

## 2021-05-31 DIAGNOSIS — M6259 Muscle wasting and atrophy, not elsewhere classified, multiple sites: Secondary | ICD-10-CM | POA: Diagnosis not present

## 2021-05-31 DIAGNOSIS — E559 Vitamin D deficiency, unspecified: Secondary | ICD-10-CM | POA: Diagnosis not present

## 2021-05-31 DIAGNOSIS — F339 Major depressive disorder, recurrent, unspecified: Secondary | ICD-10-CM | POA: Diagnosis not present

## 2021-06-01 DIAGNOSIS — E559 Vitamin D deficiency, unspecified: Secondary | ICD-10-CM | POA: Diagnosis not present

## 2021-06-01 DIAGNOSIS — F339 Major depressive disorder, recurrent, unspecified: Secondary | ICD-10-CM | POA: Diagnosis not present

## 2021-06-01 DIAGNOSIS — M6259 Muscle wasting and atrophy, not elsewhere classified, multiple sites: Secondary | ICD-10-CM | POA: Diagnosis not present

## 2021-06-01 DIAGNOSIS — M6281 Muscle weakness (generalized): Secondary | ICD-10-CM | POA: Diagnosis not present

## 2021-06-01 DIAGNOSIS — I160 Hypertensive urgency: Secondary | ICD-10-CM | POA: Diagnosis not present

## 2021-06-01 DIAGNOSIS — R41841 Cognitive communication deficit: Secondary | ICD-10-CM | POA: Diagnosis not present

## 2021-06-02 DIAGNOSIS — M6281 Muscle weakness (generalized): Secondary | ICD-10-CM | POA: Diagnosis not present

## 2021-06-02 DIAGNOSIS — F339 Major depressive disorder, recurrent, unspecified: Secondary | ICD-10-CM | POA: Diagnosis not present

## 2021-06-02 DIAGNOSIS — E559 Vitamin D deficiency, unspecified: Secondary | ICD-10-CM | POA: Diagnosis not present

## 2021-06-02 DIAGNOSIS — M6259 Muscle wasting and atrophy, not elsewhere classified, multiple sites: Secondary | ICD-10-CM | POA: Diagnosis not present

## 2021-06-02 DIAGNOSIS — I160 Hypertensive urgency: Secondary | ICD-10-CM | POA: Diagnosis not present

## 2021-06-02 DIAGNOSIS — R41841 Cognitive communication deficit: Secondary | ICD-10-CM | POA: Diagnosis not present

## 2021-06-04 DIAGNOSIS — M545 Low back pain, unspecified: Secondary | ICD-10-CM | POA: Diagnosis not present

## 2021-06-04 DIAGNOSIS — M6281 Muscle weakness (generalized): Secondary | ICD-10-CM | POA: Diagnosis not present

## 2021-06-04 DIAGNOSIS — E559 Vitamin D deficiency, unspecified: Secondary | ICD-10-CM | POA: Diagnosis not present

## 2021-06-04 DIAGNOSIS — W19XXXA Unspecified fall, initial encounter: Secondary | ICD-10-CM | POA: Diagnosis not present

## 2021-06-04 DIAGNOSIS — I160 Hypertensive urgency: Secondary | ICD-10-CM | POA: Diagnosis not present

## 2021-06-04 DIAGNOSIS — R41841 Cognitive communication deficit: Secondary | ICD-10-CM | POA: Diagnosis not present

## 2021-06-04 DIAGNOSIS — F339 Major depressive disorder, recurrent, unspecified: Secondary | ICD-10-CM | POA: Diagnosis not present

## 2021-06-04 DIAGNOSIS — M5136 Other intervertebral disc degeneration, lumbar region: Secondary | ICD-10-CM | POA: Diagnosis not present

## 2021-06-04 DIAGNOSIS — M6259 Muscle wasting and atrophy, not elsewhere classified, multiple sites: Secondary | ICD-10-CM | POA: Diagnosis not present

## 2021-06-04 DIAGNOSIS — M533 Sacrococcygeal disorders, not elsewhere classified: Secondary | ICD-10-CM | POA: Diagnosis not present

## 2021-06-05 DIAGNOSIS — I160 Hypertensive urgency: Secondary | ICD-10-CM | POA: Diagnosis not present

## 2021-06-05 DIAGNOSIS — F339 Major depressive disorder, recurrent, unspecified: Secondary | ICD-10-CM | POA: Diagnosis not present

## 2021-06-05 DIAGNOSIS — M6259 Muscle wasting and atrophy, not elsewhere classified, multiple sites: Secondary | ICD-10-CM | POA: Diagnosis not present

## 2021-06-05 DIAGNOSIS — E559 Vitamin D deficiency, unspecified: Secondary | ICD-10-CM | POA: Diagnosis not present

## 2021-06-05 DIAGNOSIS — R41841 Cognitive communication deficit: Secondary | ICD-10-CM | POA: Diagnosis not present

## 2021-06-05 DIAGNOSIS — M6281 Muscle weakness (generalized): Secondary | ICD-10-CM | POA: Diagnosis not present

## 2021-06-06 DIAGNOSIS — M6281 Muscle weakness (generalized): Secondary | ICD-10-CM | POA: Diagnosis not present

## 2021-06-06 DIAGNOSIS — I160 Hypertensive urgency: Secondary | ICD-10-CM | POA: Diagnosis not present

## 2021-06-06 DIAGNOSIS — F339 Major depressive disorder, recurrent, unspecified: Secondary | ICD-10-CM | POA: Diagnosis not present

## 2021-06-06 DIAGNOSIS — E559 Vitamin D deficiency, unspecified: Secondary | ICD-10-CM | POA: Diagnosis not present

## 2021-06-06 DIAGNOSIS — R41841 Cognitive communication deficit: Secondary | ICD-10-CM | POA: Diagnosis not present

## 2021-06-06 DIAGNOSIS — M6259 Muscle wasting and atrophy, not elsewhere classified, multiple sites: Secondary | ICD-10-CM | POA: Diagnosis not present

## 2021-06-07 DIAGNOSIS — E559 Vitamin D deficiency, unspecified: Secondary | ICD-10-CM | POA: Diagnosis not present

## 2021-06-07 DIAGNOSIS — F339 Major depressive disorder, recurrent, unspecified: Secondary | ICD-10-CM | POA: Diagnosis not present

## 2021-06-07 DIAGNOSIS — I160 Hypertensive urgency: Secondary | ICD-10-CM | POA: Diagnosis not present

## 2021-06-07 DIAGNOSIS — M6259 Muscle wasting and atrophy, not elsewhere classified, multiple sites: Secondary | ICD-10-CM | POA: Diagnosis not present

## 2021-06-07 DIAGNOSIS — M6281 Muscle weakness (generalized): Secondary | ICD-10-CM | POA: Diagnosis not present

## 2021-06-07 DIAGNOSIS — R41841 Cognitive communication deficit: Secondary | ICD-10-CM | POA: Diagnosis not present

## 2021-06-08 DIAGNOSIS — R41841 Cognitive communication deficit: Secondary | ICD-10-CM | POA: Diagnosis not present

## 2021-06-08 DIAGNOSIS — E559 Vitamin D deficiency, unspecified: Secondary | ICD-10-CM | POA: Diagnosis not present

## 2021-06-08 DIAGNOSIS — M6259 Muscle wasting and atrophy, not elsewhere classified, multiple sites: Secondary | ICD-10-CM | POA: Diagnosis not present

## 2021-06-08 DIAGNOSIS — M6281 Muscle weakness (generalized): Secondary | ICD-10-CM | POA: Diagnosis not present

## 2021-06-08 DIAGNOSIS — I160 Hypertensive urgency: Secondary | ICD-10-CM | POA: Diagnosis not present

## 2021-06-08 DIAGNOSIS — F339 Major depressive disorder, recurrent, unspecified: Secondary | ICD-10-CM | POA: Diagnosis not present

## 2021-06-09 ENCOUNTER — Ambulatory Visit (INDEPENDENT_AMBULATORY_CARE_PROVIDER_SITE_OTHER): Payer: Medicare Other

## 2021-06-09 ENCOUNTER — Other Ambulatory Visit: Payer: Self-pay

## 2021-06-09 DIAGNOSIS — G903 Multi-system degeneration of the autonomic nervous system: Secondary | ICD-10-CM | POA: Diagnosis not present

## 2021-06-09 DIAGNOSIS — Z79899 Other long term (current) drug therapy: Secondary | ICD-10-CM | POA: Diagnosis not present

## 2021-06-09 DIAGNOSIS — Z7401 Bed confinement status: Secondary | ICD-10-CM | POA: Diagnosis not present

## 2021-06-09 DIAGNOSIS — I82409 Acute embolism and thrombosis of unspecified deep veins of unspecified lower extremity: Secondary | ICD-10-CM | POA: Diagnosis not present

## 2021-06-09 DIAGNOSIS — F419 Anxiety disorder, unspecified: Secondary | ICD-10-CM | POA: Diagnosis not present

## 2021-06-09 DIAGNOSIS — M069 Rheumatoid arthritis, unspecified: Secondary | ICD-10-CM | POA: Diagnosis present

## 2021-06-09 DIAGNOSIS — Z885 Allergy status to narcotic agent status: Secondary | ICD-10-CM | POA: Diagnosis not present

## 2021-06-09 DIAGNOSIS — G9340 Encephalopathy, unspecified: Secondary | ICD-10-CM | POA: Diagnosis not present

## 2021-06-09 DIAGNOSIS — F339 Major depressive disorder, recurrent, unspecified: Secondary | ICD-10-CM | POA: Diagnosis not present

## 2021-06-09 DIAGNOSIS — R41841 Cognitive communication deficit: Secondary | ICD-10-CM | POA: Diagnosis not present

## 2021-06-09 DIAGNOSIS — M6259 Muscle wasting and atrophy, not elsewhere classified, multiple sites: Secondary | ICD-10-CM | POA: Diagnosis not present

## 2021-06-09 DIAGNOSIS — R5381 Other malaise: Secondary | ICD-10-CM | POA: Diagnosis not present

## 2021-06-09 DIAGNOSIS — I160 Hypertensive urgency: Secondary | ICD-10-CM | POA: Diagnosis not present

## 2021-06-09 DIAGNOSIS — R0602 Shortness of breath: Secondary | ICD-10-CM | POA: Diagnosis not present

## 2021-06-09 DIAGNOSIS — Z9181 History of falling: Secondary | ICD-10-CM | POA: Diagnosis not present

## 2021-06-09 DIAGNOSIS — R188 Other ascites: Secondary | ICD-10-CM | POA: Diagnosis not present

## 2021-06-09 DIAGNOSIS — E8809 Other disorders of plasma-protein metabolism, not elsewhere classified: Secondary | ICD-10-CM | POA: Diagnosis not present

## 2021-06-09 DIAGNOSIS — J90 Pleural effusion, not elsewhere classified: Secondary | ICD-10-CM | POA: Diagnosis not present

## 2021-06-09 DIAGNOSIS — E871 Hypo-osmolality and hyponatremia: Secondary | ICD-10-CM | POA: Diagnosis not present

## 2021-06-09 DIAGNOSIS — I700 Atherosclerosis of aorta: Secondary | ICD-10-CM | POA: Diagnosis not present

## 2021-06-09 DIAGNOSIS — M7989 Other specified soft tissue disorders: Secondary | ICD-10-CM

## 2021-06-09 DIAGNOSIS — N049 Nephrotic syndrome with unspecified morphologic changes: Secondary | ICD-10-CM | POA: Diagnosis not present

## 2021-06-09 DIAGNOSIS — E559 Vitamin D deficiency, unspecified: Secondary | ICD-10-CM | POA: Diagnosis not present

## 2021-06-09 DIAGNOSIS — R279 Unspecified lack of coordination: Secondary | ICD-10-CM | POA: Diagnosis not present

## 2021-06-09 DIAGNOSIS — I10 Essential (primary) hypertension: Secondary | ICD-10-CM | POA: Diagnosis present

## 2021-06-09 DIAGNOSIS — I82403 Acute embolism and thrombosis of unspecified deep veins of lower extremity, bilateral: Secondary | ICD-10-CM | POA: Diagnosis not present

## 2021-06-09 NOTE — Progress Notes (Unsigned)
Report to Darden Palmer, RN at [facility] ED.

## 2021-06-10 ENCOUNTER — Telehealth: Payer: Self-pay

## 2021-06-10 DIAGNOSIS — A419 Sepsis, unspecified organism: Secondary | ICD-10-CM | POA: Diagnosis not present

## 2021-06-10 DIAGNOSIS — Z86718 Personal history of other venous thrombosis and embolism: Secondary | ICD-10-CM | POA: Diagnosis not present

## 2021-06-10 DIAGNOSIS — R531 Weakness: Secondary | ICD-10-CM | POA: Diagnosis not present

## 2021-06-10 DIAGNOSIS — E8809 Other disorders of plasma-protein metabolism, not elsewhere classified: Secondary | ICD-10-CM | POA: Diagnosis not present

## 2021-06-10 DIAGNOSIS — R5381 Other malaise: Secondary | ICD-10-CM | POA: Diagnosis not present

## 2021-06-10 DIAGNOSIS — D692 Other nonthrombocytopenic purpura: Secondary | ICD-10-CM | POA: Diagnosis not present

## 2021-06-10 DIAGNOSIS — R6889 Other general symptoms and signs: Secondary | ICD-10-CM | POA: Diagnosis not present

## 2021-06-10 DIAGNOSIS — F419 Anxiety disorder, unspecified: Secondary | ICD-10-CM | POA: Diagnosis not present

## 2021-06-10 DIAGNOSIS — G903 Multi-system degeneration of the autonomic nervous system: Secondary | ICD-10-CM | POA: Diagnosis not present

## 2021-06-10 DIAGNOSIS — Z743 Need for continuous supervision: Secondary | ICD-10-CM | POA: Diagnosis not present

## 2021-06-10 DIAGNOSIS — G319 Degenerative disease of nervous system, unspecified: Secondary | ICD-10-CM | POA: Diagnosis not present

## 2021-06-10 DIAGNOSIS — M25511 Pain in right shoulder: Secondary | ICD-10-CM | POA: Diagnosis not present

## 2021-06-10 DIAGNOSIS — E871 Hypo-osmolality and hyponatremia: Secondary | ICD-10-CM | POA: Diagnosis not present

## 2021-06-10 DIAGNOSIS — M47812 Spondylosis without myelopathy or radiculopathy, cervical region: Secondary | ICD-10-CM | POA: Diagnosis not present

## 2021-06-10 DIAGNOSIS — M6259 Muscle wasting and atrophy, not elsewhere classified, multiple sites: Secondary | ICD-10-CM | POA: Diagnosis not present

## 2021-06-10 DIAGNOSIS — Z87440 Personal history of urinary (tract) infections: Secondary | ICD-10-CM | POA: Diagnosis not present

## 2021-06-10 DIAGNOSIS — N390 Urinary tract infection, site not specified: Secondary | ICD-10-CM | POA: Diagnosis not present

## 2021-06-10 DIAGNOSIS — I5032 Chronic diastolic (congestive) heart failure: Secondary | ICD-10-CM | POA: Diagnosis not present

## 2021-06-10 DIAGNOSIS — I959 Hypotension, unspecified: Secondary | ICD-10-CM | POA: Diagnosis not present

## 2021-06-10 DIAGNOSIS — D649 Anemia, unspecified: Secondary | ICD-10-CM | POA: Diagnosis not present

## 2021-06-10 DIAGNOSIS — S069X0A Unspecified intracranial injury without loss of consciousness, initial encounter: Secondary | ICD-10-CM | POA: Diagnosis not present

## 2021-06-10 DIAGNOSIS — M1990 Unspecified osteoarthritis, unspecified site: Secondary | ICD-10-CM | POA: Diagnosis not present

## 2021-06-10 DIAGNOSIS — W19XXXA Unspecified fall, initial encounter: Secondary | ICD-10-CM | POA: Diagnosis not present

## 2021-06-10 DIAGNOSIS — F01511 Vascular dementia, unspecified severity, with agitation: Secondary | ICD-10-CM | POA: Diagnosis not present

## 2021-06-10 DIAGNOSIS — R601 Generalized edema: Secondary | ICD-10-CM | POA: Diagnosis not present

## 2021-06-10 DIAGNOSIS — J90 Pleural effusion, not elsewhere classified: Secondary | ICD-10-CM | POA: Diagnosis not present

## 2021-06-10 DIAGNOSIS — E559 Vitamin D deficiency, unspecified: Secondary | ICD-10-CM | POA: Diagnosis not present

## 2021-06-10 DIAGNOSIS — S0990XA Unspecified injury of head, initial encounter: Secondary | ICD-10-CM | POA: Diagnosis not present

## 2021-06-10 DIAGNOSIS — M7989 Other specified soft tissue disorders: Secondary | ICD-10-CM | POA: Diagnosis not present

## 2021-06-10 DIAGNOSIS — M2578 Osteophyte, vertebrae: Secondary | ICD-10-CM | POA: Diagnosis not present

## 2021-06-10 DIAGNOSIS — R609 Edema, unspecified: Secondary | ICD-10-CM | POA: Diagnosis not present

## 2021-06-10 DIAGNOSIS — I160 Hypertensive urgency: Secondary | ICD-10-CM | POA: Diagnosis not present

## 2021-06-10 DIAGNOSIS — L03113 Cellulitis of right upper limb: Secondary | ICD-10-CM | POA: Diagnosis not present

## 2021-06-10 DIAGNOSIS — R7889 Finding of other specified substances, not normally found in blood: Secondary | ICD-10-CM | POA: Diagnosis not present

## 2021-06-10 DIAGNOSIS — S22040A Wedge compression fracture of fourth thoracic vertebra, initial encounter for closed fracture: Secondary | ICD-10-CM | POA: Diagnosis not present

## 2021-06-10 DIAGNOSIS — Z95828 Presence of other vascular implants and grafts: Secondary | ICD-10-CM | POA: Diagnosis not present

## 2021-06-10 DIAGNOSIS — I509 Heart failure, unspecified: Secondary | ICD-10-CM | POA: Diagnosis not present

## 2021-06-10 DIAGNOSIS — R600 Localized edema: Secondary | ICD-10-CM | POA: Diagnosis not present

## 2021-06-10 DIAGNOSIS — Z66 Do not resuscitate: Secondary | ICD-10-CM | POA: Diagnosis not present

## 2021-06-10 DIAGNOSIS — L03115 Cellulitis of right lower limb: Secondary | ICD-10-CM | POA: Diagnosis not present

## 2021-06-10 DIAGNOSIS — N179 Acute kidney failure, unspecified: Secondary | ICD-10-CM | POA: Diagnosis not present

## 2021-06-10 DIAGNOSIS — F0390 Unspecified dementia without behavioral disturbance: Secondary | ICD-10-CM | POA: Diagnosis not present

## 2021-06-10 DIAGNOSIS — J189 Pneumonia, unspecified organism: Secondary | ICD-10-CM | POA: Diagnosis not present

## 2021-06-10 DIAGNOSIS — S22030A Wedge compression fracture of third thoracic vertebra, initial encounter for closed fracture: Secondary | ICD-10-CM | POA: Diagnosis not present

## 2021-06-10 DIAGNOSIS — Z9981 Dependence on supplemental oxygen: Secondary | ICD-10-CM | POA: Diagnosis not present

## 2021-06-10 DIAGNOSIS — I517 Cardiomegaly: Secondary | ICD-10-CM | POA: Diagnosis not present

## 2021-06-10 DIAGNOSIS — I10 Essential (primary) hypertension: Secondary | ICD-10-CM | POA: Diagnosis not present

## 2021-06-10 DIAGNOSIS — N049 Nephrotic syndrome with unspecified morphologic changes: Secondary | ICD-10-CM | POA: Diagnosis not present

## 2021-06-10 DIAGNOSIS — Z043 Encounter for examination and observation following other accident: Secondary | ICD-10-CM | POA: Diagnosis not present

## 2021-06-10 DIAGNOSIS — R2689 Other abnormalities of gait and mobility: Secondary | ICD-10-CM | POA: Diagnosis not present

## 2021-06-10 DIAGNOSIS — I82403 Acute embolism and thrombosis of unspecified deep veins of lower extremity, bilateral: Secondary | ICD-10-CM | POA: Diagnosis not present

## 2021-06-10 DIAGNOSIS — Z7401 Bed confinement status: Secondary | ICD-10-CM | POA: Diagnosis not present

## 2021-06-10 DIAGNOSIS — R279 Unspecified lack of coordination: Secondary | ICD-10-CM | POA: Diagnosis not present

## 2021-06-10 DIAGNOSIS — G9340 Encephalopathy, unspecified: Secondary | ICD-10-CM | POA: Diagnosis not present

## 2021-06-10 DIAGNOSIS — R918 Other nonspecific abnormal finding of lung field: Secondary | ICD-10-CM | POA: Diagnosis not present

## 2021-06-10 DIAGNOSIS — E041 Nontoxic single thyroid nodule: Secondary | ICD-10-CM | POA: Diagnosis not present

## 2021-06-10 DIAGNOSIS — J9811 Atelectasis: Secondary | ICD-10-CM | POA: Diagnosis not present

## 2021-06-10 DIAGNOSIS — J439 Emphysema, unspecified: Secondary | ICD-10-CM | POA: Diagnosis not present

## 2021-06-10 DIAGNOSIS — F339 Major depressive disorder, recurrent, unspecified: Secondary | ICD-10-CM | POA: Diagnosis not present

## 2021-06-10 DIAGNOSIS — R296 Repeated falls: Secondary | ICD-10-CM | POA: Diagnosis not present

## 2021-06-10 DIAGNOSIS — Z885 Allergy status to narcotic agent status: Secondary | ICD-10-CM | POA: Diagnosis not present

## 2021-06-10 DIAGNOSIS — Z79899 Other long term (current) drug therapy: Secondary | ICD-10-CM | POA: Diagnosis not present

## 2021-06-10 DIAGNOSIS — Z792 Long term (current) use of antibiotics: Secondary | ICD-10-CM | POA: Diagnosis not present

## 2021-06-10 DIAGNOSIS — I110 Hypertensive heart disease with heart failure: Secondary | ICD-10-CM | POA: Diagnosis not present

## 2021-06-10 DIAGNOSIS — R41841 Cognitive communication deficit: Secondary | ICD-10-CM | POA: Diagnosis not present

## 2021-06-10 DIAGNOSIS — I82409 Acute embolism and thrombosis of unspecified deep veins of unspecified lower extremity: Secondary | ICD-10-CM | POA: Diagnosis not present

## 2021-06-10 DIAGNOSIS — S13140A Subluxation of C3/C4 cervical vertebrae, initial encounter: Secondary | ICD-10-CM | POA: Diagnosis not present

## 2021-06-10 DIAGNOSIS — L03116 Cellulitis of left lower limb: Secondary | ICD-10-CM | POA: Diagnosis not present

## 2021-06-10 DIAGNOSIS — R2231 Localized swelling, mass and lump, right upper limb: Secondary | ICD-10-CM | POA: Diagnosis not present

## 2021-06-10 DIAGNOSIS — Z9181 History of falling: Secondary | ICD-10-CM | POA: Diagnosis not present

## 2021-06-10 NOTE — Telephone Encounter (Signed)
-----   Message from Jenean Lindau, MD sent at 06/09/2021  5:04 PM EST ----- As discussed patient to be sent to the emergency room ASAP.  Thank you copy primary care Jenean Lindau, MD 06/09/2021 5:04 PM

## 2021-06-10 NOTE — Telephone Encounter (Signed)
Left message on patients voicemail to please return our call.   

## 2021-06-11 DIAGNOSIS — R5381 Other malaise: Secondary | ICD-10-CM | POA: Diagnosis not present

## 2021-06-11 DIAGNOSIS — I5032 Chronic diastolic (congestive) heart failure: Secondary | ICD-10-CM | POA: Diagnosis not present

## 2021-06-11 DIAGNOSIS — I82403 Acute embolism and thrombosis of unspecified deep veins of lower extremity, bilateral: Secondary | ICD-10-CM | POA: Diagnosis not present

## 2021-06-11 DIAGNOSIS — F0390 Unspecified dementia without behavioral disturbance: Secondary | ICD-10-CM | POA: Diagnosis not present

## 2021-06-18 DIAGNOSIS — S069X0A Unspecified intracranial injury without loss of consciousness, initial encounter: Secondary | ICD-10-CM | POA: Diagnosis not present

## 2021-06-24 DIAGNOSIS — R6889 Other general symptoms and signs: Secondary | ICD-10-CM | POA: Diagnosis not present

## 2021-06-25 DIAGNOSIS — R6889 Other general symptoms and signs: Secondary | ICD-10-CM | POA: Diagnosis not present

## 2021-06-25 DIAGNOSIS — I5032 Chronic diastolic (congestive) heart failure: Secondary | ICD-10-CM | POA: Diagnosis not present

## 2021-06-25 DIAGNOSIS — F419 Anxiety disorder, unspecified: Secondary | ICD-10-CM | POA: Diagnosis not present

## 2021-07-01 DIAGNOSIS — F0390 Unspecified dementia without behavioral disturbance: Secondary | ICD-10-CM | POA: Diagnosis not present

## 2021-07-01 DIAGNOSIS — I509 Heart failure, unspecified: Secondary | ICD-10-CM | POA: Diagnosis not present

## 2021-07-08 ENCOUNTER — Other Ambulatory Visit: Payer: Self-pay

## 2021-07-09 DIAGNOSIS — R601 Generalized edema: Secondary | ICD-10-CM | POA: Diagnosis not present

## 2021-07-09 DIAGNOSIS — G319 Degenerative disease of nervous system, unspecified: Secondary | ICD-10-CM | POA: Diagnosis not present

## 2021-07-09 DIAGNOSIS — I517 Cardiomegaly: Secondary | ICD-10-CM | POA: Diagnosis not present

## 2021-07-09 DIAGNOSIS — Z043 Encounter for examination and observation following other accident: Secondary | ICD-10-CM | POA: Diagnosis not present

## 2021-07-09 DIAGNOSIS — F0390 Unspecified dementia without behavioral disturbance: Secondary | ICD-10-CM | POA: Diagnosis not present

## 2021-07-09 DIAGNOSIS — M2578 Osteophyte, vertebrae: Secondary | ICD-10-CM | POA: Diagnosis not present

## 2021-07-09 DIAGNOSIS — S0990XA Unspecified injury of head, initial encounter: Secondary | ICD-10-CM | POA: Diagnosis not present

## 2021-07-09 DIAGNOSIS — E041 Nontoxic single thyroid nodule: Secondary | ICD-10-CM | POA: Diagnosis not present

## 2021-07-09 DIAGNOSIS — M47812 Spondylosis without myelopathy or radiculopathy, cervical region: Secondary | ICD-10-CM | POA: Diagnosis not present

## 2021-07-09 DIAGNOSIS — J439 Emphysema, unspecified: Secondary | ICD-10-CM | POA: Diagnosis not present

## 2021-07-09 DIAGNOSIS — S13140A Subluxation of C3/C4 cervical vertebrae, initial encounter: Secondary | ICD-10-CM | POA: Diagnosis not present

## 2021-07-09 DIAGNOSIS — L03113 Cellulitis of right upper limb: Secondary | ICD-10-CM | POA: Diagnosis not present

## 2021-07-09 DIAGNOSIS — S22030A Wedge compression fracture of third thoracic vertebra, initial encounter for closed fracture: Secondary | ICD-10-CM | POA: Diagnosis not present

## 2021-07-10 ENCOUNTER — Ambulatory Visit: Payer: Medicare Other | Admitting: Cardiology

## 2021-07-14 DIAGNOSIS — I5032 Chronic diastolic (congestive) heart failure: Secondary | ICD-10-CM | POA: Diagnosis not present

## 2021-07-14 DIAGNOSIS — S22040A Wedge compression fracture of fourth thoracic vertebra, initial encounter for closed fracture: Secondary | ICD-10-CM | POA: Diagnosis not present

## 2021-07-14 DIAGNOSIS — F01511 Vascular dementia, unspecified severity, with agitation: Secondary | ICD-10-CM | POA: Diagnosis not present

## 2021-07-14 DIAGNOSIS — D692 Other nonthrombocytopenic purpura: Secondary | ICD-10-CM | POA: Diagnosis not present

## 2021-07-15 DIAGNOSIS — S22040A Wedge compression fracture of fourth thoracic vertebra, initial encounter for closed fracture: Secondary | ICD-10-CM | POA: Diagnosis not present

## 2021-07-15 DIAGNOSIS — S22030A Wedge compression fracture of third thoracic vertebra, initial encounter for closed fracture: Secondary | ICD-10-CM | POA: Diagnosis not present

## 2021-07-31 DIAGNOSIS — Z87440 Personal history of urinary (tract) infections: Secondary | ICD-10-CM | POA: Diagnosis not present

## 2021-07-31 DIAGNOSIS — F419 Anxiety disorder, unspecified: Secondary | ICD-10-CM | POA: Diagnosis present

## 2021-07-31 DIAGNOSIS — N049 Nephrotic syndrome with unspecified morphologic changes: Secondary | ICD-10-CM | POA: Diagnosis not present

## 2021-07-31 DIAGNOSIS — L03115 Cellulitis of right lower limb: Secondary | ICD-10-CM | POA: Diagnosis not present

## 2021-07-31 DIAGNOSIS — N179 Acute kidney failure, unspecified: Secondary | ICD-10-CM | POA: Diagnosis not present

## 2021-07-31 DIAGNOSIS — Z79899 Other long term (current) drug therapy: Secondary | ICD-10-CM | POA: Diagnosis not present

## 2021-07-31 DIAGNOSIS — G903 Multi-system degeneration of the autonomic nervous system: Secondary | ICD-10-CM | POA: Diagnosis not present

## 2021-07-31 DIAGNOSIS — E559 Vitamin D deficiency, unspecified: Secondary | ICD-10-CM | POA: Diagnosis not present

## 2021-07-31 DIAGNOSIS — A419 Sepsis, unspecified organism: Secondary | ICD-10-CM | POA: Diagnosis not present

## 2021-07-31 DIAGNOSIS — G9340 Encephalopathy, unspecified: Secondary | ICD-10-CM | POA: Diagnosis not present

## 2021-07-31 DIAGNOSIS — R600 Localized edema: Secondary | ICD-10-CM | POA: Diagnosis present

## 2021-07-31 DIAGNOSIS — Z9981 Dependence on supplemental oxygen: Secondary | ICD-10-CM | POA: Diagnosis not present

## 2021-07-31 DIAGNOSIS — M1990 Unspecified osteoarthritis, unspecified site: Secondary | ICD-10-CM | POA: Diagnosis not present

## 2021-07-31 DIAGNOSIS — R531 Weakness: Secondary | ICD-10-CM | POA: Diagnosis not present

## 2021-07-31 DIAGNOSIS — Z86718 Personal history of other venous thrombosis and embolism: Secondary | ICD-10-CM | POA: Diagnosis not present

## 2021-07-31 DIAGNOSIS — I110 Hypertensive heart disease with heart failure: Secondary | ICD-10-CM | POA: Diagnosis not present

## 2021-07-31 DIAGNOSIS — Z792 Long term (current) use of antibiotics: Secondary | ICD-10-CM | POA: Diagnosis not present

## 2021-07-31 DIAGNOSIS — I10 Essential (primary) hypertension: Secondary | ICD-10-CM | POA: Diagnosis present

## 2021-07-31 DIAGNOSIS — F339 Major depressive disorder, recurrent, unspecified: Secondary | ICD-10-CM | POA: Diagnosis not present

## 2021-07-31 DIAGNOSIS — E8809 Other disorders of plasma-protein metabolism, not elsewhere classified: Secondary | ICD-10-CM | POA: Diagnosis present

## 2021-07-31 DIAGNOSIS — Z885 Allergy status to narcotic agent status: Secondary | ICD-10-CM | POA: Diagnosis not present

## 2021-07-31 DIAGNOSIS — R2689 Other abnormalities of gait and mobility: Secondary | ICD-10-CM | POA: Diagnosis present

## 2021-07-31 DIAGNOSIS — Z95828 Presence of other vascular implants and grafts: Secondary | ICD-10-CM | POA: Diagnosis not present

## 2021-07-31 DIAGNOSIS — R296 Repeated falls: Secondary | ICD-10-CM | POA: Diagnosis present

## 2021-07-31 DIAGNOSIS — Z66 Do not resuscitate: Secondary | ICD-10-CM | POA: Diagnosis not present

## 2021-07-31 DIAGNOSIS — J189 Pneumonia, unspecified organism: Secondary | ICD-10-CM | POA: Diagnosis not present

## 2021-07-31 DIAGNOSIS — R609 Edema, unspecified: Secondary | ICD-10-CM | POA: Diagnosis not present

## 2021-07-31 DIAGNOSIS — Z7401 Bed confinement status: Secondary | ICD-10-CM | POA: Diagnosis not present

## 2021-07-31 DIAGNOSIS — R41841 Cognitive communication deficit: Secondary | ICD-10-CM | POA: Diagnosis not present

## 2021-07-31 DIAGNOSIS — R7889 Finding of other specified substances, not normally found in blood: Secondary | ICD-10-CM | POA: Diagnosis not present

## 2021-07-31 DIAGNOSIS — L03116 Cellulitis of left lower limb: Secondary | ICD-10-CM | POA: Diagnosis not present

## 2021-07-31 DIAGNOSIS — I959 Hypotension, unspecified: Secondary | ICD-10-CM | POA: Diagnosis not present

## 2021-07-31 DIAGNOSIS — J90 Pleural effusion, not elsewhere classified: Secondary | ICD-10-CM | POA: Diagnosis not present

## 2021-07-31 DIAGNOSIS — I160 Hypertensive urgency: Secondary | ICD-10-CM | POA: Diagnosis not present

## 2021-07-31 DIAGNOSIS — E871 Hypo-osmolality and hyponatremia: Secondary | ICD-10-CM | POA: Diagnosis not present

## 2021-07-31 DIAGNOSIS — D649 Anemia, unspecified: Secondary | ICD-10-CM | POA: Diagnosis not present

## 2021-07-31 DIAGNOSIS — R069 Unspecified abnormalities of breathing: Secondary | ICD-10-CM | POA: Diagnosis not present

## 2021-07-31 DIAGNOSIS — I509 Heart failure, unspecified: Secondary | ICD-10-CM | POA: Diagnosis not present

## 2021-07-31 DIAGNOSIS — M6259 Muscle wasting and atrophy, not elsewhere classified, multiple sites: Secondary | ICD-10-CM | POA: Diagnosis not present

## 2021-07-31 DIAGNOSIS — I82403 Acute embolism and thrombosis of unspecified deep veins of lower extremity, bilateral: Secondary | ICD-10-CM | POA: Diagnosis not present

## 2021-08-03 DIAGNOSIS — N049 Nephrotic syndrome with unspecified morphologic changes: Secondary | ICD-10-CM | POA: Diagnosis not present

## 2021-08-03 DIAGNOSIS — R41841 Cognitive communication deficit: Secondary | ICD-10-CM | POA: Diagnosis not present

## 2021-08-03 DIAGNOSIS — G903 Multi-system degeneration of the autonomic nervous system: Secondary | ICD-10-CM | POA: Diagnosis not present

## 2021-08-03 DIAGNOSIS — A419 Sepsis, unspecified organism: Secondary | ICD-10-CM | POA: Diagnosis not present

## 2021-08-03 DIAGNOSIS — R069 Unspecified abnormalities of breathing: Secondary | ICD-10-CM | POA: Diagnosis not present

## 2021-08-03 DIAGNOSIS — Z7401 Bed confinement status: Secondary | ICD-10-CM | POA: Diagnosis not present

## 2021-08-03 DIAGNOSIS — E559 Vitamin D deficiency, unspecified: Secondary | ICD-10-CM | POA: Diagnosis not present

## 2021-08-03 DIAGNOSIS — F01511 Vascular dementia, unspecified severity, with agitation: Secondary | ICD-10-CM | POA: Diagnosis not present

## 2021-08-03 DIAGNOSIS — I82403 Acute embolism and thrombosis of unspecified deep veins of lower extremity, bilateral: Secondary | ICD-10-CM | POA: Diagnosis not present

## 2021-08-03 DIAGNOSIS — J189 Pneumonia, unspecified organism: Secondary | ICD-10-CM | POA: Diagnosis not present

## 2021-08-03 DIAGNOSIS — I160 Hypertensive urgency: Secondary | ICD-10-CM | POA: Diagnosis not present

## 2021-08-03 DIAGNOSIS — F339 Major depressive disorder, recurrent, unspecified: Secondary | ICD-10-CM | POA: Diagnosis not present

## 2021-08-03 DIAGNOSIS — E871 Hypo-osmolality and hyponatremia: Secondary | ICD-10-CM | POA: Diagnosis not present

## 2021-08-03 DIAGNOSIS — I10 Essential (primary) hypertension: Secondary | ICD-10-CM | POA: Diagnosis not present

## 2021-08-03 DIAGNOSIS — R5381 Other malaise: Secondary | ICD-10-CM | POA: Diagnosis not present

## 2021-08-03 DIAGNOSIS — M6259 Muscle wasting and atrophy, not elsewhere classified, multiple sites: Secondary | ICD-10-CM | POA: Diagnosis not present

## 2021-08-03 DIAGNOSIS — D692 Other nonthrombocytopenic purpura: Secondary | ICD-10-CM | POA: Diagnosis not present

## 2021-08-03 DIAGNOSIS — G9340 Encephalopathy, unspecified: Secondary | ICD-10-CM | POA: Diagnosis not present

## 2021-08-04 DIAGNOSIS — F01511 Vascular dementia, unspecified severity, with agitation: Secondary | ICD-10-CM | POA: Diagnosis not present

## 2021-08-04 DIAGNOSIS — D692 Other nonthrombocytopenic purpura: Secondary | ICD-10-CM | POA: Diagnosis not present

## 2021-08-04 DIAGNOSIS — R5381 Other malaise: Secondary | ICD-10-CM | POA: Diagnosis not present

## 2021-08-04 DIAGNOSIS — J189 Pneumonia, unspecified organism: Secondary | ICD-10-CM | POA: Diagnosis not present

## 2021-08-07 ENCOUNTER — Ambulatory Visit: Payer: Medicare Other | Admitting: Cardiology

## 2021-08-09 DIAGNOSIS — F0390 Unspecified dementia without behavioral disturbance: Secondary | ICD-10-CM | POA: Diagnosis not present

## 2021-08-09 DIAGNOSIS — Z86718 Personal history of other venous thrombosis and embolism: Secondary | ICD-10-CM | POA: Diagnosis not present

## 2021-08-09 DIAGNOSIS — Z8639 Personal history of other endocrine, nutritional and metabolic disease: Secondary | ICD-10-CM | POA: Diagnosis not present

## 2021-08-09 DIAGNOSIS — F419 Anxiety disorder, unspecified: Secondary | ICD-10-CM | POA: Diagnosis not present

## 2021-08-09 DIAGNOSIS — Z872 Personal history of diseases of the skin and subcutaneous tissue: Secondary | ICD-10-CM | POA: Diagnosis not present

## 2021-08-09 DIAGNOSIS — I509 Heart failure, unspecified: Secondary | ICD-10-CM | POA: Diagnosis not present

## 2021-08-09 DIAGNOSIS — F32A Depression, unspecified: Secondary | ICD-10-CM | POA: Diagnosis not present

## 2021-08-09 DIAGNOSIS — N049 Nephrotic syndrome with unspecified morphologic changes: Secondary | ICD-10-CM | POA: Diagnosis not present

## 2021-08-09 DIAGNOSIS — G903 Multi-system degeneration of the autonomic nervous system: Secondary | ICD-10-CM | POA: Diagnosis not present

## 2021-08-09 DIAGNOSIS — J309 Allergic rhinitis, unspecified: Secondary | ICD-10-CM | POA: Diagnosis not present

## 2021-08-09 DIAGNOSIS — E559 Vitamin D deficiency, unspecified: Secondary | ICD-10-CM | POA: Diagnosis not present

## 2021-08-09 DIAGNOSIS — Z9981 Dependence on supplemental oxygen: Secondary | ICD-10-CM | POA: Diagnosis not present

## 2021-08-09 DIAGNOSIS — Z8701 Personal history of pneumonia (recurrent): Secondary | ICD-10-CM | POA: Diagnosis not present

## 2021-08-09 DIAGNOSIS — I110 Hypertensive heart disease with heart failure: Secondary | ICD-10-CM | POA: Diagnosis not present

## 2021-08-10 DIAGNOSIS — N049 Nephrotic syndrome with unspecified morphologic changes: Secondary | ICD-10-CM | POA: Diagnosis not present

## 2021-08-10 DIAGNOSIS — Z872 Personal history of diseases of the skin and subcutaneous tissue: Secondary | ICD-10-CM | POA: Diagnosis not present

## 2021-08-10 DIAGNOSIS — I509 Heart failure, unspecified: Secondary | ICD-10-CM | POA: Diagnosis not present

## 2021-08-10 DIAGNOSIS — F32A Depression, unspecified: Secondary | ICD-10-CM | POA: Diagnosis not present

## 2021-08-10 DIAGNOSIS — F419 Anxiety disorder, unspecified: Secondary | ICD-10-CM | POA: Diagnosis not present

## 2021-08-10 DIAGNOSIS — I110 Hypertensive heart disease with heart failure: Secondary | ICD-10-CM | POA: Diagnosis not present

## 2021-08-11 DIAGNOSIS — F01511 Vascular dementia, unspecified severity, with agitation: Secondary | ICD-10-CM | POA: Diagnosis not present

## 2021-08-11 DIAGNOSIS — I5032 Chronic diastolic (congestive) heart failure: Secondary | ICD-10-CM | POA: Diagnosis not present

## 2021-08-11 DIAGNOSIS — D692 Other nonthrombocytopenic purpura: Secondary | ICD-10-CM | POA: Diagnosis not present

## 2021-08-11 DIAGNOSIS — N049 Nephrotic syndrome with unspecified morphologic changes: Secondary | ICD-10-CM | POA: Diagnosis not present

## 2021-08-13 DIAGNOSIS — F419 Anxiety disorder, unspecified: Secondary | ICD-10-CM | POA: Diagnosis not present

## 2021-08-13 DIAGNOSIS — I110 Hypertensive heart disease with heart failure: Secondary | ICD-10-CM | POA: Diagnosis not present

## 2021-08-13 DIAGNOSIS — N049 Nephrotic syndrome with unspecified morphologic changes: Secondary | ICD-10-CM | POA: Diagnosis not present

## 2021-08-13 DIAGNOSIS — F32A Depression, unspecified: Secondary | ICD-10-CM | POA: Diagnosis not present

## 2021-08-13 DIAGNOSIS — I509 Heart failure, unspecified: Secondary | ICD-10-CM | POA: Diagnosis not present

## 2021-08-13 DIAGNOSIS — Z872 Personal history of diseases of the skin and subcutaneous tissue: Secondary | ICD-10-CM | POA: Diagnosis not present

## 2021-08-14 DIAGNOSIS — F419 Anxiety disorder, unspecified: Secondary | ICD-10-CM | POA: Diagnosis not present

## 2021-08-14 DIAGNOSIS — I110 Hypertensive heart disease with heart failure: Secondary | ICD-10-CM | POA: Diagnosis not present

## 2021-08-14 DIAGNOSIS — N049 Nephrotic syndrome with unspecified morphologic changes: Secondary | ICD-10-CM | POA: Diagnosis not present

## 2021-08-14 DIAGNOSIS — Z872 Personal history of diseases of the skin and subcutaneous tissue: Secondary | ICD-10-CM | POA: Diagnosis not present

## 2021-08-14 DIAGNOSIS — I509 Heart failure, unspecified: Secondary | ICD-10-CM | POA: Diagnosis not present

## 2021-08-14 DIAGNOSIS — F32A Depression, unspecified: Secondary | ICD-10-CM | POA: Diagnosis not present

## 2021-08-16 DIAGNOSIS — Z872 Personal history of diseases of the skin and subcutaneous tissue: Secondary | ICD-10-CM | POA: Diagnosis not present

## 2021-08-16 DIAGNOSIS — I110 Hypertensive heart disease with heart failure: Secondary | ICD-10-CM | POA: Diagnosis not present

## 2021-08-16 DIAGNOSIS — F419 Anxiety disorder, unspecified: Secondary | ICD-10-CM | POA: Diagnosis not present

## 2021-08-16 DIAGNOSIS — F32A Depression, unspecified: Secondary | ICD-10-CM | POA: Diagnosis not present

## 2021-08-16 DIAGNOSIS — N049 Nephrotic syndrome with unspecified morphologic changes: Secondary | ICD-10-CM | POA: Diagnosis not present

## 2021-08-16 DIAGNOSIS — I509 Heart failure, unspecified: Secondary | ICD-10-CM | POA: Diagnosis not present

## 2021-08-17 DIAGNOSIS — Z872 Personal history of diseases of the skin and subcutaneous tissue: Secondary | ICD-10-CM | POA: Diagnosis not present

## 2021-08-17 DIAGNOSIS — F419 Anxiety disorder, unspecified: Secondary | ICD-10-CM | POA: Diagnosis not present

## 2021-08-17 DIAGNOSIS — F32A Depression, unspecified: Secondary | ICD-10-CM | POA: Diagnosis not present

## 2021-08-17 DIAGNOSIS — I509 Heart failure, unspecified: Secondary | ICD-10-CM | POA: Diagnosis not present

## 2021-08-17 DIAGNOSIS — I110 Hypertensive heart disease with heart failure: Secondary | ICD-10-CM | POA: Diagnosis not present

## 2021-08-17 DIAGNOSIS — N049 Nephrotic syndrome with unspecified morphologic changes: Secondary | ICD-10-CM | POA: Diagnosis not present

## 2021-08-19 ENCOUNTER — Telehealth: Payer: Self-pay | Admitting: Cardiology

## 2021-08-19 NOTE — Telephone Encounter (Signed)
Per Marylu LundJanet (daughter) patient is going on hospice  ?

## 2021-08-20 ENCOUNTER — Ambulatory Visit: Payer: Medicare Other | Admitting: Cardiology

## 2021-08-20 DIAGNOSIS — N049 Nephrotic syndrome with unspecified morphologic changes: Secondary | ICD-10-CM | POA: Diagnosis not present

## 2021-08-20 DIAGNOSIS — F32A Depression, unspecified: Secondary | ICD-10-CM | POA: Diagnosis not present

## 2021-08-20 DIAGNOSIS — Z872 Personal history of diseases of the skin and subcutaneous tissue: Secondary | ICD-10-CM | POA: Diagnosis not present

## 2021-08-20 DIAGNOSIS — I110 Hypertensive heart disease with heart failure: Secondary | ICD-10-CM | POA: Diagnosis not present

## 2021-08-20 DIAGNOSIS — I509 Heart failure, unspecified: Secondary | ICD-10-CM | POA: Diagnosis not present

## 2021-08-20 DIAGNOSIS — F419 Anxiety disorder, unspecified: Secondary | ICD-10-CM | POA: Diagnosis not present

## 2021-08-21 DIAGNOSIS — Z872 Personal history of diseases of the skin and subcutaneous tissue: Secondary | ICD-10-CM | POA: Diagnosis not present

## 2021-08-21 DIAGNOSIS — F32A Depression, unspecified: Secondary | ICD-10-CM | POA: Diagnosis not present

## 2021-08-21 DIAGNOSIS — N049 Nephrotic syndrome with unspecified morphologic changes: Secondary | ICD-10-CM | POA: Diagnosis not present

## 2021-08-21 DIAGNOSIS — I509 Heart failure, unspecified: Secondary | ICD-10-CM | POA: Diagnosis not present

## 2021-08-21 DIAGNOSIS — I110 Hypertensive heart disease with heart failure: Secondary | ICD-10-CM | POA: Diagnosis not present

## 2021-08-21 DIAGNOSIS — F419 Anxiety disorder, unspecified: Secondary | ICD-10-CM | POA: Diagnosis not present

## 2021-08-22 DIAGNOSIS — N049 Nephrotic syndrome with unspecified morphologic changes: Secondary | ICD-10-CM | POA: Diagnosis not present

## 2021-08-22 DIAGNOSIS — I509 Heart failure, unspecified: Secondary | ICD-10-CM | POA: Diagnosis not present

## 2021-08-22 DIAGNOSIS — F419 Anxiety disorder, unspecified: Secondary | ICD-10-CM | POA: Diagnosis not present

## 2021-08-22 DIAGNOSIS — I110 Hypertensive heart disease with heart failure: Secondary | ICD-10-CM | POA: Diagnosis not present

## 2021-08-22 DIAGNOSIS — F32A Depression, unspecified: Secondary | ICD-10-CM | POA: Diagnosis not present

## 2021-08-22 DIAGNOSIS — Z872 Personal history of diseases of the skin and subcutaneous tissue: Secondary | ICD-10-CM | POA: Diagnosis not present

## 2021-08-23 DIAGNOSIS — Z872 Personal history of diseases of the skin and subcutaneous tissue: Secondary | ICD-10-CM | POA: Diagnosis not present

## 2021-08-23 DIAGNOSIS — I509 Heart failure, unspecified: Secondary | ICD-10-CM | POA: Diagnosis not present

## 2021-08-23 DIAGNOSIS — F419 Anxiety disorder, unspecified: Secondary | ICD-10-CM | POA: Diagnosis not present

## 2021-08-23 DIAGNOSIS — F32A Depression, unspecified: Secondary | ICD-10-CM | POA: Diagnosis not present

## 2021-08-23 DIAGNOSIS — I110 Hypertensive heart disease with heart failure: Secondary | ICD-10-CM | POA: Diagnosis not present

## 2021-08-23 DIAGNOSIS — N049 Nephrotic syndrome with unspecified morphologic changes: Secondary | ICD-10-CM | POA: Diagnosis not present

## 2021-08-24 DIAGNOSIS — J309 Allergic rhinitis, unspecified: Secondary | ICD-10-CM | POA: Diagnosis not present

## 2021-08-24 DIAGNOSIS — Z872 Personal history of diseases of the skin and subcutaneous tissue: Secondary | ICD-10-CM | POA: Diagnosis not present

## 2021-08-24 DIAGNOSIS — F32A Depression, unspecified: Secondary | ICD-10-CM | POA: Diagnosis not present

## 2021-08-24 DIAGNOSIS — G903 Multi-system degeneration of the autonomic nervous system: Secondary | ICD-10-CM | POA: Diagnosis not present

## 2021-08-24 DIAGNOSIS — I110 Hypertensive heart disease with heart failure: Secondary | ICD-10-CM | POA: Diagnosis not present

## 2021-08-24 DIAGNOSIS — Z8639 Personal history of other endocrine, nutritional and metabolic disease: Secondary | ICD-10-CM | POA: Diagnosis not present

## 2021-08-24 DIAGNOSIS — E559 Vitamin D deficiency, unspecified: Secondary | ICD-10-CM | POA: Diagnosis not present

## 2021-08-24 DIAGNOSIS — F0390 Unspecified dementia without behavioral disturbance: Secondary | ICD-10-CM | POA: Diagnosis not present

## 2021-08-24 DIAGNOSIS — Z86718 Personal history of other venous thrombosis and embolism: Secondary | ICD-10-CM | POA: Diagnosis not present

## 2021-08-24 DIAGNOSIS — N049 Nephrotic syndrome with unspecified morphologic changes: Secondary | ICD-10-CM | POA: Diagnosis not present

## 2021-08-24 DIAGNOSIS — Z9981 Dependence on supplemental oxygen: Secondary | ICD-10-CM | POA: Diagnosis not present

## 2021-08-24 DIAGNOSIS — F419 Anxiety disorder, unspecified: Secondary | ICD-10-CM | POA: Diagnosis not present

## 2021-08-24 DIAGNOSIS — I509 Heart failure, unspecified: Secondary | ICD-10-CM | POA: Diagnosis not present

## 2021-08-24 DIAGNOSIS — Z8701 Personal history of pneumonia (recurrent): Secondary | ICD-10-CM | POA: Diagnosis not present

## 2021-08-26 DIAGNOSIS — I509 Heart failure, unspecified: Secondary | ICD-10-CM | POA: Diagnosis not present

## 2021-08-26 DIAGNOSIS — F419 Anxiety disorder, unspecified: Secondary | ICD-10-CM | POA: Diagnosis not present

## 2021-08-26 DIAGNOSIS — N049 Nephrotic syndrome with unspecified morphologic changes: Secondary | ICD-10-CM | POA: Diagnosis not present

## 2021-08-26 DIAGNOSIS — Z872 Personal history of diseases of the skin and subcutaneous tissue: Secondary | ICD-10-CM | POA: Diagnosis not present

## 2021-08-26 DIAGNOSIS — F32A Depression, unspecified: Secondary | ICD-10-CM | POA: Diagnosis not present

## 2021-08-26 DIAGNOSIS — I110 Hypertensive heart disease with heart failure: Secondary | ICD-10-CM | POA: Diagnosis not present

## 2021-08-27 DIAGNOSIS — I509 Heart failure, unspecified: Secondary | ICD-10-CM | POA: Diagnosis not present

## 2021-08-27 DIAGNOSIS — F32A Depression, unspecified: Secondary | ICD-10-CM | POA: Diagnosis not present

## 2021-08-27 DIAGNOSIS — F419 Anxiety disorder, unspecified: Secondary | ICD-10-CM | POA: Diagnosis not present

## 2021-08-27 DIAGNOSIS — Z872 Personal history of diseases of the skin and subcutaneous tissue: Secondary | ICD-10-CM | POA: Diagnosis not present

## 2021-08-27 DIAGNOSIS — N049 Nephrotic syndrome with unspecified morphologic changes: Secondary | ICD-10-CM | POA: Diagnosis not present

## 2021-08-27 DIAGNOSIS — I110 Hypertensive heart disease with heart failure: Secondary | ICD-10-CM | POA: Diagnosis not present

## 2021-08-28 DIAGNOSIS — Z872 Personal history of diseases of the skin and subcutaneous tissue: Secondary | ICD-10-CM | POA: Diagnosis not present

## 2021-08-28 DIAGNOSIS — N049 Nephrotic syndrome with unspecified morphologic changes: Secondary | ICD-10-CM | POA: Diagnosis not present

## 2021-08-28 DIAGNOSIS — I509 Heart failure, unspecified: Secondary | ICD-10-CM | POA: Diagnosis not present

## 2021-08-28 DIAGNOSIS — I110 Hypertensive heart disease with heart failure: Secondary | ICD-10-CM | POA: Diagnosis not present

## 2021-08-28 DIAGNOSIS — F419 Anxiety disorder, unspecified: Secondary | ICD-10-CM | POA: Diagnosis not present

## 2021-08-28 DIAGNOSIS — F32A Depression, unspecified: Secondary | ICD-10-CM | POA: Diagnosis not present

## 2021-08-30 DIAGNOSIS — N049 Nephrotic syndrome with unspecified morphologic changes: Secondary | ICD-10-CM | POA: Diagnosis not present

## 2021-08-30 DIAGNOSIS — Z872 Personal history of diseases of the skin and subcutaneous tissue: Secondary | ICD-10-CM | POA: Diagnosis not present

## 2021-08-30 DIAGNOSIS — F32A Depression, unspecified: Secondary | ICD-10-CM | POA: Diagnosis not present

## 2021-08-30 DIAGNOSIS — F419 Anxiety disorder, unspecified: Secondary | ICD-10-CM | POA: Diagnosis not present

## 2021-08-30 DIAGNOSIS — I509 Heart failure, unspecified: Secondary | ICD-10-CM | POA: Diagnosis not present

## 2021-08-30 DIAGNOSIS — I110 Hypertensive heart disease with heart failure: Secondary | ICD-10-CM | POA: Diagnosis not present

## 2021-08-31 DIAGNOSIS — F419 Anxiety disorder, unspecified: Secondary | ICD-10-CM | POA: Diagnosis not present

## 2021-08-31 DIAGNOSIS — Z872 Personal history of diseases of the skin and subcutaneous tissue: Secondary | ICD-10-CM | POA: Diagnosis not present

## 2021-08-31 DIAGNOSIS — N049 Nephrotic syndrome with unspecified morphologic changes: Secondary | ICD-10-CM | POA: Diagnosis not present

## 2021-08-31 DIAGNOSIS — I110 Hypertensive heart disease with heart failure: Secondary | ICD-10-CM | POA: Diagnosis not present

## 2021-08-31 DIAGNOSIS — F32A Depression, unspecified: Secondary | ICD-10-CM | POA: Diagnosis not present

## 2021-08-31 DIAGNOSIS — I509 Heart failure, unspecified: Secondary | ICD-10-CM | POA: Diagnosis not present

## 2021-09-01 DIAGNOSIS — F419 Anxiety disorder, unspecified: Secondary | ICD-10-CM | POA: Diagnosis not present

## 2021-09-01 DIAGNOSIS — Z872 Personal history of diseases of the skin and subcutaneous tissue: Secondary | ICD-10-CM | POA: Diagnosis not present

## 2021-09-01 DIAGNOSIS — I110 Hypertensive heart disease with heart failure: Secondary | ICD-10-CM | POA: Diagnosis not present

## 2021-09-01 DIAGNOSIS — I509 Heart failure, unspecified: Secondary | ICD-10-CM | POA: Diagnosis not present

## 2021-09-01 DIAGNOSIS — F32A Depression, unspecified: Secondary | ICD-10-CM | POA: Diagnosis not present

## 2021-09-01 DIAGNOSIS — N049 Nephrotic syndrome with unspecified morphologic changes: Secondary | ICD-10-CM | POA: Diagnosis not present

## 2021-09-02 DIAGNOSIS — F32A Depression, unspecified: Secondary | ICD-10-CM | POA: Diagnosis not present

## 2021-09-02 DIAGNOSIS — Z872 Personal history of diseases of the skin and subcutaneous tissue: Secondary | ICD-10-CM | POA: Diagnosis not present

## 2021-09-02 DIAGNOSIS — F419 Anxiety disorder, unspecified: Secondary | ICD-10-CM | POA: Diagnosis not present

## 2021-09-02 DIAGNOSIS — I509 Heart failure, unspecified: Secondary | ICD-10-CM | POA: Diagnosis not present

## 2021-09-02 DIAGNOSIS — N049 Nephrotic syndrome with unspecified morphologic changes: Secondary | ICD-10-CM | POA: Diagnosis not present

## 2021-09-02 DIAGNOSIS — I110 Hypertensive heart disease with heart failure: Secondary | ICD-10-CM | POA: Diagnosis not present

## 2021-09-03 DIAGNOSIS — I509 Heart failure, unspecified: Secondary | ICD-10-CM | POA: Diagnosis not present

## 2021-09-03 DIAGNOSIS — I110 Hypertensive heart disease with heart failure: Secondary | ICD-10-CM | POA: Diagnosis not present

## 2021-09-03 DIAGNOSIS — Z872 Personal history of diseases of the skin and subcutaneous tissue: Secondary | ICD-10-CM | POA: Diagnosis not present

## 2021-09-03 DIAGNOSIS — N049 Nephrotic syndrome with unspecified morphologic changes: Secondary | ICD-10-CM | POA: Diagnosis not present

## 2021-09-03 DIAGNOSIS — F419 Anxiety disorder, unspecified: Secondary | ICD-10-CM | POA: Diagnosis not present

## 2021-09-03 DIAGNOSIS — F32A Depression, unspecified: Secondary | ICD-10-CM | POA: Diagnosis not present

## 2021-09-04 DIAGNOSIS — I509 Heart failure, unspecified: Secondary | ICD-10-CM | POA: Diagnosis not present

## 2021-09-04 DIAGNOSIS — N049 Nephrotic syndrome with unspecified morphologic changes: Secondary | ICD-10-CM | POA: Diagnosis not present

## 2021-09-04 DIAGNOSIS — F32A Depression, unspecified: Secondary | ICD-10-CM | POA: Diagnosis not present

## 2021-09-04 DIAGNOSIS — I110 Hypertensive heart disease with heart failure: Secondary | ICD-10-CM | POA: Diagnosis not present

## 2021-09-04 DIAGNOSIS — F419 Anxiety disorder, unspecified: Secondary | ICD-10-CM | POA: Diagnosis not present

## 2021-09-04 DIAGNOSIS — Z872 Personal history of diseases of the skin and subcutaneous tissue: Secondary | ICD-10-CM | POA: Diagnosis not present

## 2021-09-07 DIAGNOSIS — I110 Hypertensive heart disease with heart failure: Secondary | ICD-10-CM | POA: Diagnosis not present

## 2021-09-07 DIAGNOSIS — F419 Anxiety disorder, unspecified: Secondary | ICD-10-CM | POA: Diagnosis not present

## 2021-09-07 DIAGNOSIS — F32A Depression, unspecified: Secondary | ICD-10-CM | POA: Diagnosis not present

## 2021-09-07 DIAGNOSIS — N049 Nephrotic syndrome with unspecified morphologic changes: Secondary | ICD-10-CM | POA: Diagnosis not present

## 2021-09-07 DIAGNOSIS — Z872 Personal history of diseases of the skin and subcutaneous tissue: Secondary | ICD-10-CM | POA: Diagnosis not present

## 2021-09-07 DIAGNOSIS — I509 Heart failure, unspecified: Secondary | ICD-10-CM | POA: Diagnosis not present

## 2021-09-24 DEATH — deceased
